# Patient Record
Sex: Male | Born: 1945 | Race: White | Hispanic: No | Marital: Married | Smoking: Former smoker
Health system: Southern US, Community
[De-identification: ages and names within clinical notes are randomized; demographics above are authoritative.]

## PROBLEM LIST (undated history)

## (undated) DIAGNOSIS — I10 Essential (primary) hypertension: Secondary | ICD-10-CM

## (undated) DIAGNOSIS — I2510 Atherosclerotic heart disease of native coronary artery without angina pectoris: Secondary | ICD-10-CM

## (undated) DIAGNOSIS — J45909 Unspecified asthma, uncomplicated: Secondary | ICD-10-CM

## (undated) DIAGNOSIS — E785 Hyperlipidemia, unspecified: Secondary | ICD-10-CM

## (undated) HISTORY — PX: BACK SURGERY: SHX140

## (undated) HISTORY — DX: Unspecified asthma, uncomplicated: J45.909

## (undated) HISTORY — DX: Hyperlipidemia, unspecified: E78.5

## (undated) HISTORY — PX: KNEE SURGERY: SHX244

## (undated) HISTORY — DX: Atherosclerotic heart disease of native coronary artery without angina pectoris: I25.10

## (undated) HISTORY — DX: Essential (primary) hypertension: I10

---

## 2004-08-05 ENCOUNTER — Ambulatory Visit: Payer: Self-pay | Admitting: Orthopaedic Surgery

## 2004-09-27 ENCOUNTER — Ambulatory Visit: Payer: Self-pay | Admitting: Unknown Physician Specialty

## 2004-09-30 ENCOUNTER — Ambulatory Visit: Payer: Self-pay | Admitting: Unknown Physician Specialty

## 2004-12-25 ENCOUNTER — Ambulatory Visit: Payer: Self-pay | Admitting: Orthopaedic Surgery

## 2007-09-13 ENCOUNTER — Ambulatory Visit: Payer: Self-pay | Admitting: Unknown Physician Specialty

## 2007-09-15 ENCOUNTER — Ambulatory Visit: Payer: Self-pay | Admitting: Unknown Physician Specialty

## 2007-10-01 ENCOUNTER — Ambulatory Visit: Payer: Self-pay | Admitting: Rheumatology

## 2007-10-14 ENCOUNTER — Ambulatory Visit: Payer: Self-pay | Admitting: Unknown Physician Specialty

## 2007-11-02 ENCOUNTER — Ambulatory Visit: Payer: Self-pay | Admitting: Unknown Physician Specialty

## 2009-07-31 ENCOUNTER — Ambulatory Visit (HOSPITAL_COMMUNITY)
Admission: AD | Admit: 2009-07-31 | Discharge: 2009-08-01 | Payer: Self-pay | Source: Home / Self Care | Admitting: Specialist

## 2010-05-13 LAB — DIFFERENTIAL
Basophils Absolute: 0 10*3/uL (ref 0.0–0.1)
Eosinophils Relative: 0 % (ref 0–5)
Lymphs Abs: 1.3 10*3/uL (ref 0.7–4.0)
Neutro Abs: 11 10*3/uL — ABNORMAL HIGH (ref 1.7–7.7)

## 2010-05-13 LAB — URINALYSIS, MICROSCOPIC ONLY
Bilirubin Urine: NEGATIVE
Glucose, UA: NEGATIVE mg/dL
Ketones, ur: NEGATIVE mg/dL
Nitrite: NEGATIVE
Protein, ur: NEGATIVE mg/dL
Specific Gravity, Urine: 1.008 (ref 1.005–1.030)

## 2010-05-13 LAB — PROTIME-INR
INR: 0.97 (ref 0.00–1.49)
Prothrombin Time: 12.8 seconds (ref 11.6–15.2)

## 2010-05-13 LAB — CBC
Hemoglobin: 14.4 g/dL (ref 13.0–17.0)
RBC: 4.58 MIL/uL (ref 4.22–5.81)
WBC: 13 10*3/uL — ABNORMAL HIGH (ref 4.0–10.5)

## 2010-05-13 LAB — COMPREHENSIVE METABOLIC PANEL
ALT: 37 U/L (ref 0–53)
AST: 28 U/L (ref 0–37)
Albumin: 3.7 g/dL (ref 3.5–5.2)
Calcium: 9.3 mg/dL (ref 8.4–10.5)
Glucose, Bld: 121 mg/dL — ABNORMAL HIGH (ref 70–99)
Potassium: 4.1 mEq/L (ref 3.5–5.1)
Sodium: 135 mEq/L (ref 135–145)
Total Bilirubin: 0.7 mg/dL (ref 0.3–1.2)
Total Protein: 6.7 g/dL (ref 6.0–8.3)

## 2014-04-18 DIAGNOSIS — M25562 Pain in left knee: Secondary | ICD-10-CM

## 2014-04-18 DIAGNOSIS — M1711 Unilateral primary osteoarthritis, right knee: Secondary | ICD-10-CM

## 2014-04-18 HISTORY — DX: Unilateral primary osteoarthritis, right knee: M17.11

## 2014-04-18 HISTORY — DX: Pain in left knee: M25.562

## 2014-04-27 ENCOUNTER — Ambulatory Visit: Payer: Self-pay | Admitting: Unknown Physician Specialty

## 2014-05-12 ENCOUNTER — Ambulatory Visit: Payer: Self-pay | Admitting: Unknown Physician Specialty

## 2014-05-18 DIAGNOSIS — S83242D Other tear of medial meniscus, current injury, left knee, subsequent encounter: Secondary | ICD-10-CM | POA: Insufficient documentation

## 2014-05-18 HISTORY — DX: Other tear of medial meniscus, current injury, left knee, subsequent encounter: S83.242D

## 2014-12-05 DIAGNOSIS — I2510 Atherosclerotic heart disease of native coronary artery without angina pectoris: Secondary | ICD-10-CM

## 2014-12-05 DIAGNOSIS — E785 Hyperlipidemia, unspecified: Secondary | ICD-10-CM | POA: Insufficient documentation

## 2014-12-05 HISTORY — DX: Atherosclerotic heart disease of native coronary artery without angina pectoris: I25.10

## 2016-02-27 DIAGNOSIS — I10 Essential (primary) hypertension: Secondary | ICD-10-CM | POA: Diagnosis not present

## 2016-02-27 DIAGNOSIS — I25119 Atherosclerotic heart disease of native coronary artery with unspecified angina pectoris: Secondary | ICD-10-CM | POA: Diagnosis not present

## 2016-02-27 DIAGNOSIS — E784 Other hyperlipidemia: Secondary | ICD-10-CM | POA: Diagnosis not present

## 2016-03-06 DIAGNOSIS — I252 Old myocardial infarction: Secondary | ICD-10-CM | POA: Diagnosis not present

## 2016-03-06 DIAGNOSIS — I10 Essential (primary) hypertension: Secondary | ICD-10-CM | POA: Diagnosis not present

## 2016-03-06 DIAGNOSIS — E785 Hyperlipidemia, unspecified: Secondary | ICD-10-CM | POA: Diagnosis not present

## 2016-03-06 DIAGNOSIS — Z87891 Personal history of nicotine dependence: Secondary | ICD-10-CM | POA: Diagnosis not present

## 2016-03-06 DIAGNOSIS — K219 Gastro-esophageal reflux disease without esophagitis: Secondary | ICD-10-CM | POA: Diagnosis not present

## 2016-03-06 DIAGNOSIS — M1712 Unilateral primary osteoarthritis, left knee: Secondary | ICD-10-CM | POA: Diagnosis not present

## 2016-03-06 DIAGNOSIS — Z7982 Long term (current) use of aspirin: Secondary | ICD-10-CM | POA: Diagnosis not present

## 2016-03-06 DIAGNOSIS — Z96651 Presence of right artificial knee joint: Secondary | ICD-10-CM | POA: Diagnosis not present

## 2016-03-06 DIAGNOSIS — Z471 Aftercare following joint replacement surgery: Secondary | ICD-10-CM | POA: Diagnosis not present

## 2016-03-06 DIAGNOSIS — Z79891 Long term (current) use of opiate analgesic: Secondary | ICD-10-CM | POA: Diagnosis not present

## 2016-05-28 DIAGNOSIS — I10 Essential (primary) hypertension: Secondary | ICD-10-CM | POA: Diagnosis not present

## 2016-05-28 DIAGNOSIS — E784 Other hyperlipidemia: Secondary | ICD-10-CM | POA: Diagnosis not present

## 2016-05-28 DIAGNOSIS — I25119 Atherosclerotic heart disease of native coronary artery with unspecified angina pectoris: Secondary | ICD-10-CM | POA: Diagnosis not present

## 2016-07-22 DIAGNOSIS — I10 Essential (primary) hypertension: Secondary | ICD-10-CM | POA: Diagnosis not present

## 2016-07-22 DIAGNOSIS — Z1211 Encounter for screening for malignant neoplasm of colon: Secondary | ICD-10-CM | POA: Diagnosis not present

## 2016-07-22 DIAGNOSIS — Z125 Encounter for screening for malignant neoplasm of prostate: Secondary | ICD-10-CM | POA: Diagnosis not present

## 2016-07-22 DIAGNOSIS — Z0000 Encounter for general adult medical examination without abnormal findings: Secondary | ICD-10-CM | POA: Diagnosis not present

## 2016-07-22 DIAGNOSIS — E785 Hyperlipidemia, unspecified: Secondary | ICD-10-CM | POA: Diagnosis not present

## 2016-08-07 DIAGNOSIS — I119 Hypertensive heart disease without heart failure: Secondary | ICD-10-CM | POA: Diagnosis not present

## 2016-08-07 DIAGNOSIS — Z79899 Other long term (current) drug therapy: Secondary | ICD-10-CM | POA: Diagnosis not present

## 2016-08-07 DIAGNOSIS — M545 Low back pain: Secondary | ICD-10-CM | POA: Diagnosis not present

## 2016-08-07 DIAGNOSIS — E785 Hyperlipidemia, unspecified: Secondary | ICD-10-CM | POA: Diagnosis not present

## 2016-11-11 DIAGNOSIS — Z6830 Body mass index (BMI) 30.0-30.9, adult: Secondary | ICD-10-CM | POA: Diagnosis not present

## 2016-11-11 DIAGNOSIS — E785 Hyperlipidemia, unspecified: Secondary | ICD-10-CM | POA: Diagnosis not present

## 2016-11-11 DIAGNOSIS — K219 Gastro-esophageal reflux disease without esophagitis: Secondary | ICD-10-CM | POA: Diagnosis not present

## 2016-11-11 DIAGNOSIS — M130 Polyarthritis, unspecified: Secondary | ICD-10-CM | POA: Diagnosis not present

## 2016-11-11 DIAGNOSIS — G4700 Insomnia, unspecified: Secondary | ICD-10-CM | POA: Diagnosis not present

## 2016-11-11 DIAGNOSIS — I252 Old myocardial infarction: Secondary | ICD-10-CM | POA: Diagnosis not present

## 2016-11-11 DIAGNOSIS — I10 Essential (primary) hypertension: Secondary | ICD-10-CM | POA: Diagnosis not present

## 2016-11-11 DIAGNOSIS — Z0000 Encounter for general adult medical examination without abnormal findings: Secondary | ICD-10-CM | POA: Diagnosis not present

## 2016-11-11 DIAGNOSIS — Z7982 Long term (current) use of aspirin: Secondary | ICD-10-CM | POA: Diagnosis not present

## 2016-11-11 DIAGNOSIS — E669 Obesity, unspecified: Secondary | ICD-10-CM | POA: Diagnosis not present

## 2016-12-04 DIAGNOSIS — M170 Bilateral primary osteoarthritis of knee: Secondary | ICD-10-CM | POA: Diagnosis not present

## 2016-12-16 DIAGNOSIS — E785 Hyperlipidemia, unspecified: Secondary | ICD-10-CM | POA: Insufficient documentation

## 2016-12-16 DIAGNOSIS — Z87891 Personal history of nicotine dependence: Secondary | ICD-10-CM | POA: Diagnosis not present

## 2016-12-16 DIAGNOSIS — Z7982 Long term (current) use of aspirin: Secondary | ICD-10-CM | POA: Diagnosis not present

## 2016-12-16 DIAGNOSIS — I2510 Atherosclerotic heart disease of native coronary artery without angina pectoris: Secondary | ICD-10-CM | POA: Diagnosis not present

## 2016-12-16 HISTORY — DX: Hyperlipidemia, unspecified: E78.5

## 2017-01-21 DIAGNOSIS — Z01812 Encounter for preprocedural laboratory examination: Secondary | ICD-10-CM | POA: Diagnosis not present

## 2017-01-21 DIAGNOSIS — M1711 Unilateral primary osteoarthritis, right knee: Secondary | ICD-10-CM | POA: Diagnosis not present

## 2017-01-21 DIAGNOSIS — Z01818 Encounter for other preprocedural examination: Secondary | ICD-10-CM | POA: Diagnosis not present

## 2017-01-21 DIAGNOSIS — I10 Essential (primary) hypertension: Secondary | ICD-10-CM | POA: Diagnosis not present

## 2017-01-21 DIAGNOSIS — I252 Old myocardial infarction: Secondary | ICD-10-CM | POA: Diagnosis not present

## 2017-02-06 DIAGNOSIS — I10 Essential (primary) hypertension: Secondary | ICD-10-CM | POA: Diagnosis not present

## 2017-02-06 DIAGNOSIS — M1711 Unilateral primary osteoarthritis, right knee: Secondary | ICD-10-CM | POA: Diagnosis not present

## 2017-02-06 DIAGNOSIS — E785 Hyperlipidemia, unspecified: Secondary | ICD-10-CM | POA: Diagnosis not present

## 2017-02-06 DIAGNOSIS — M25561 Pain in right knee: Secondary | ICD-10-CM | POA: Diagnosis not present

## 2017-02-06 DIAGNOSIS — Z7982 Long term (current) use of aspirin: Secondary | ICD-10-CM | POA: Diagnosis not present

## 2017-02-06 DIAGNOSIS — M1712 Unilateral primary osteoarthritis, left knee: Secondary | ICD-10-CM | POA: Diagnosis not present

## 2017-02-06 DIAGNOSIS — Z9861 Coronary angioplasty status: Secondary | ICD-10-CM | POA: Diagnosis not present

## 2017-02-06 DIAGNOSIS — G8918 Other acute postprocedural pain: Secondary | ICD-10-CM | POA: Diagnosis not present

## 2017-02-06 DIAGNOSIS — K219 Gastro-esophageal reflux disease without esophagitis: Secondary | ICD-10-CM | POA: Diagnosis not present

## 2017-02-06 DIAGNOSIS — I252 Old myocardial infarction: Secondary | ICD-10-CM | POA: Diagnosis not present

## 2017-02-07 DIAGNOSIS — M1711 Unilateral primary osteoarthritis, right knee: Secondary | ICD-10-CM | POA: Diagnosis not present

## 2017-02-09 DIAGNOSIS — E785 Hyperlipidemia, unspecified: Secondary | ICD-10-CM | POA: Diagnosis not present

## 2017-02-09 DIAGNOSIS — I10 Essential (primary) hypertension: Secondary | ICD-10-CM | POA: Diagnosis not present

## 2017-02-09 DIAGNOSIS — Z471 Aftercare following joint replacement surgery: Secondary | ICD-10-CM | POA: Diagnosis not present

## 2017-02-09 DIAGNOSIS — K219 Gastro-esophageal reflux disease without esophagitis: Secondary | ICD-10-CM | POA: Diagnosis not present

## 2017-02-09 DIAGNOSIS — M1712 Unilateral primary osteoarthritis, left knee: Secondary | ICD-10-CM | POA: Diagnosis not present

## 2017-02-09 DIAGNOSIS — Z7982 Long term (current) use of aspirin: Secondary | ICD-10-CM | POA: Diagnosis not present

## 2017-02-09 DIAGNOSIS — Z79891 Long term (current) use of opiate analgesic: Secondary | ICD-10-CM | POA: Diagnosis not present

## 2017-02-09 DIAGNOSIS — I252 Old myocardial infarction: Secondary | ICD-10-CM | POA: Diagnosis not present

## 2017-02-09 DIAGNOSIS — Z87891 Personal history of nicotine dependence: Secondary | ICD-10-CM | POA: Diagnosis not present

## 2017-02-09 DIAGNOSIS — Z96651 Presence of right artificial knee joint: Secondary | ICD-10-CM | POA: Diagnosis not present

## 2017-02-10 DIAGNOSIS — Z96651 Presence of right artificial knee joint: Secondary | ICD-10-CM | POA: Diagnosis not present

## 2017-02-11 DIAGNOSIS — Z79891 Long term (current) use of opiate analgesic: Secondary | ICD-10-CM | POA: Diagnosis not present

## 2017-02-11 DIAGNOSIS — Z87891 Personal history of nicotine dependence: Secondary | ICD-10-CM | POA: Diagnosis not present

## 2017-02-11 DIAGNOSIS — M1712 Unilateral primary osteoarthritis, left knee: Secondary | ICD-10-CM | POA: Diagnosis not present

## 2017-02-11 DIAGNOSIS — E785 Hyperlipidemia, unspecified: Secondary | ICD-10-CM | POA: Diagnosis not present

## 2017-02-11 DIAGNOSIS — Z471 Aftercare following joint replacement surgery: Secondary | ICD-10-CM | POA: Diagnosis not present

## 2017-02-11 DIAGNOSIS — Z7982 Long term (current) use of aspirin: Secondary | ICD-10-CM | POA: Diagnosis not present

## 2017-02-11 DIAGNOSIS — I252 Old myocardial infarction: Secondary | ICD-10-CM | POA: Diagnosis not present

## 2017-02-11 DIAGNOSIS — K219 Gastro-esophageal reflux disease without esophagitis: Secondary | ICD-10-CM | POA: Diagnosis not present

## 2017-02-11 DIAGNOSIS — Z96651 Presence of right artificial knee joint: Secondary | ICD-10-CM | POA: Diagnosis not present

## 2017-02-11 DIAGNOSIS — I10 Essential (primary) hypertension: Secondary | ICD-10-CM | POA: Diagnosis not present

## 2017-02-13 DIAGNOSIS — Z79891 Long term (current) use of opiate analgesic: Secondary | ICD-10-CM | POA: Diagnosis not present

## 2017-02-13 DIAGNOSIS — Z7982 Long term (current) use of aspirin: Secondary | ICD-10-CM | POA: Diagnosis not present

## 2017-02-13 DIAGNOSIS — E785 Hyperlipidemia, unspecified: Secondary | ICD-10-CM | POA: Diagnosis not present

## 2017-02-13 DIAGNOSIS — K219 Gastro-esophageal reflux disease without esophagitis: Secondary | ICD-10-CM | POA: Diagnosis not present

## 2017-02-13 DIAGNOSIS — M1712 Unilateral primary osteoarthritis, left knee: Secondary | ICD-10-CM | POA: Diagnosis not present

## 2017-02-13 DIAGNOSIS — I252 Old myocardial infarction: Secondary | ICD-10-CM | POA: Diagnosis not present

## 2017-02-13 DIAGNOSIS — Z471 Aftercare following joint replacement surgery: Secondary | ICD-10-CM | POA: Diagnosis not present

## 2017-02-13 DIAGNOSIS — Z96651 Presence of right artificial knee joint: Secondary | ICD-10-CM | POA: Diagnosis not present

## 2017-02-13 DIAGNOSIS — I10 Essential (primary) hypertension: Secondary | ICD-10-CM | POA: Diagnosis not present

## 2017-02-13 DIAGNOSIS — Z87891 Personal history of nicotine dependence: Secondary | ICD-10-CM | POA: Diagnosis not present

## 2017-02-16 DIAGNOSIS — Z471 Aftercare following joint replacement surgery: Secondary | ICD-10-CM | POA: Diagnosis not present

## 2017-02-16 DIAGNOSIS — Z7982 Long term (current) use of aspirin: Secondary | ICD-10-CM | POA: Diagnosis not present

## 2017-02-16 DIAGNOSIS — E785 Hyperlipidemia, unspecified: Secondary | ICD-10-CM | POA: Diagnosis not present

## 2017-02-16 DIAGNOSIS — Z96651 Presence of right artificial knee joint: Secondary | ICD-10-CM | POA: Diagnosis not present

## 2017-02-16 DIAGNOSIS — M1712 Unilateral primary osteoarthritis, left knee: Secondary | ICD-10-CM | POA: Diagnosis not present

## 2017-02-16 DIAGNOSIS — I252 Old myocardial infarction: Secondary | ICD-10-CM | POA: Diagnosis not present

## 2017-02-16 DIAGNOSIS — Z87891 Personal history of nicotine dependence: Secondary | ICD-10-CM | POA: Diagnosis not present

## 2017-02-16 DIAGNOSIS — Z79891 Long term (current) use of opiate analgesic: Secondary | ICD-10-CM | POA: Diagnosis not present

## 2017-02-16 DIAGNOSIS — K219 Gastro-esophageal reflux disease without esophagitis: Secondary | ICD-10-CM | POA: Diagnosis not present

## 2017-02-16 DIAGNOSIS — I10 Essential (primary) hypertension: Secondary | ICD-10-CM | POA: Diagnosis not present

## 2017-02-20 DIAGNOSIS — I252 Old myocardial infarction: Secondary | ICD-10-CM | POA: Diagnosis not present

## 2017-02-20 DIAGNOSIS — E785 Hyperlipidemia, unspecified: Secondary | ICD-10-CM | POA: Diagnosis not present

## 2017-02-20 DIAGNOSIS — Z7982 Long term (current) use of aspirin: Secondary | ICD-10-CM | POA: Diagnosis not present

## 2017-02-20 DIAGNOSIS — Z471 Aftercare following joint replacement surgery: Secondary | ICD-10-CM | POA: Diagnosis not present

## 2017-02-20 DIAGNOSIS — Z87891 Personal history of nicotine dependence: Secondary | ICD-10-CM | POA: Diagnosis not present

## 2017-02-20 DIAGNOSIS — Z96651 Presence of right artificial knee joint: Secondary | ICD-10-CM | POA: Diagnosis not present

## 2017-02-20 DIAGNOSIS — Z79891 Long term (current) use of opiate analgesic: Secondary | ICD-10-CM | POA: Diagnosis not present

## 2017-02-20 DIAGNOSIS — K219 Gastro-esophageal reflux disease without esophagitis: Secondary | ICD-10-CM | POA: Diagnosis not present

## 2017-02-20 DIAGNOSIS — I10 Essential (primary) hypertension: Secondary | ICD-10-CM | POA: Diagnosis not present

## 2017-02-20 DIAGNOSIS — M1712 Unilateral primary osteoarthritis, left knee: Secondary | ICD-10-CM | POA: Diagnosis not present

## 2017-02-21 DIAGNOSIS — M1712 Unilateral primary osteoarthritis, left knee: Secondary | ICD-10-CM | POA: Diagnosis not present

## 2017-02-21 DIAGNOSIS — Z471 Aftercare following joint replacement surgery: Secondary | ICD-10-CM | POA: Diagnosis not present

## 2017-02-21 DIAGNOSIS — K219 Gastro-esophageal reflux disease without esophagitis: Secondary | ICD-10-CM | POA: Diagnosis not present

## 2017-02-21 DIAGNOSIS — Z96651 Presence of right artificial knee joint: Secondary | ICD-10-CM | POA: Diagnosis not present

## 2017-02-21 DIAGNOSIS — E785 Hyperlipidemia, unspecified: Secondary | ICD-10-CM | POA: Diagnosis not present

## 2017-02-21 DIAGNOSIS — Z7982 Long term (current) use of aspirin: Secondary | ICD-10-CM | POA: Diagnosis not present

## 2017-02-21 DIAGNOSIS — Z79891 Long term (current) use of opiate analgesic: Secondary | ICD-10-CM | POA: Diagnosis not present

## 2017-02-21 DIAGNOSIS — I10 Essential (primary) hypertension: Secondary | ICD-10-CM | POA: Diagnosis not present

## 2017-02-21 DIAGNOSIS — I252 Old myocardial infarction: Secondary | ICD-10-CM | POA: Diagnosis not present

## 2017-02-21 DIAGNOSIS — Z87891 Personal history of nicotine dependence: Secondary | ICD-10-CM | POA: Diagnosis not present

## 2017-02-23 DIAGNOSIS — M1712 Unilateral primary osteoarthritis, left knee: Secondary | ICD-10-CM | POA: Diagnosis not present

## 2017-02-23 DIAGNOSIS — I10 Essential (primary) hypertension: Secondary | ICD-10-CM | POA: Diagnosis not present

## 2017-02-23 DIAGNOSIS — Z87891 Personal history of nicotine dependence: Secondary | ICD-10-CM | POA: Diagnosis not present

## 2017-02-23 DIAGNOSIS — Z96651 Presence of right artificial knee joint: Secondary | ICD-10-CM | POA: Diagnosis not present

## 2017-02-23 DIAGNOSIS — K219 Gastro-esophageal reflux disease without esophagitis: Secondary | ICD-10-CM | POA: Diagnosis not present

## 2017-02-23 DIAGNOSIS — Z79891 Long term (current) use of opiate analgesic: Secondary | ICD-10-CM | POA: Diagnosis not present

## 2017-02-23 DIAGNOSIS — Z7982 Long term (current) use of aspirin: Secondary | ICD-10-CM | POA: Diagnosis not present

## 2017-02-23 DIAGNOSIS — E785 Hyperlipidemia, unspecified: Secondary | ICD-10-CM | POA: Diagnosis not present

## 2017-02-23 DIAGNOSIS — Z471 Aftercare following joint replacement surgery: Secondary | ICD-10-CM | POA: Diagnosis not present

## 2017-02-23 DIAGNOSIS — I252 Old myocardial infarction: Secondary | ICD-10-CM | POA: Diagnosis not present

## 2017-02-25 DIAGNOSIS — I252 Old myocardial infarction: Secondary | ICD-10-CM | POA: Diagnosis not present

## 2017-02-25 DIAGNOSIS — Z79891 Long term (current) use of opiate analgesic: Secondary | ICD-10-CM | POA: Diagnosis not present

## 2017-02-25 DIAGNOSIS — I10 Essential (primary) hypertension: Secondary | ICD-10-CM | POA: Diagnosis not present

## 2017-02-25 DIAGNOSIS — E785 Hyperlipidemia, unspecified: Secondary | ICD-10-CM | POA: Diagnosis not present

## 2017-02-25 DIAGNOSIS — Z96651 Presence of right artificial knee joint: Secondary | ICD-10-CM | POA: Diagnosis not present

## 2017-02-25 DIAGNOSIS — M1712 Unilateral primary osteoarthritis, left knee: Secondary | ICD-10-CM | POA: Diagnosis not present

## 2017-02-25 DIAGNOSIS — K219 Gastro-esophageal reflux disease without esophagitis: Secondary | ICD-10-CM | POA: Diagnosis not present

## 2017-02-25 DIAGNOSIS — Z471 Aftercare following joint replacement surgery: Secondary | ICD-10-CM | POA: Diagnosis not present

## 2017-02-25 DIAGNOSIS — Z87891 Personal history of nicotine dependence: Secondary | ICD-10-CM | POA: Diagnosis not present

## 2017-02-25 DIAGNOSIS — Z7982 Long term (current) use of aspirin: Secondary | ICD-10-CM | POA: Diagnosis not present

## 2017-02-27 DIAGNOSIS — M1712 Unilateral primary osteoarthritis, left knee: Secondary | ICD-10-CM | POA: Diagnosis not present

## 2017-02-27 DIAGNOSIS — K219 Gastro-esophageal reflux disease without esophagitis: Secondary | ICD-10-CM | POA: Diagnosis not present

## 2017-02-27 DIAGNOSIS — I252 Old myocardial infarction: Secondary | ICD-10-CM | POA: Diagnosis not present

## 2017-02-27 DIAGNOSIS — Z7982 Long term (current) use of aspirin: Secondary | ICD-10-CM | POA: Diagnosis not present

## 2017-02-27 DIAGNOSIS — Z87891 Personal history of nicotine dependence: Secondary | ICD-10-CM | POA: Diagnosis not present

## 2017-02-27 DIAGNOSIS — Z96651 Presence of right artificial knee joint: Secondary | ICD-10-CM | POA: Diagnosis not present

## 2017-02-27 DIAGNOSIS — Z471 Aftercare following joint replacement surgery: Secondary | ICD-10-CM | POA: Diagnosis not present

## 2017-02-27 DIAGNOSIS — E785 Hyperlipidemia, unspecified: Secondary | ICD-10-CM | POA: Diagnosis not present

## 2017-02-27 DIAGNOSIS — I10 Essential (primary) hypertension: Secondary | ICD-10-CM | POA: Diagnosis not present

## 2017-02-27 DIAGNOSIS — Z79891 Long term (current) use of opiate analgesic: Secondary | ICD-10-CM | POA: Diagnosis not present

## 2017-03-02 DIAGNOSIS — Z471 Aftercare following joint replacement surgery: Secondary | ICD-10-CM | POA: Diagnosis not present

## 2017-03-02 DIAGNOSIS — I252 Old myocardial infarction: Secondary | ICD-10-CM | POA: Diagnosis not present

## 2017-03-02 DIAGNOSIS — I10 Essential (primary) hypertension: Secondary | ICD-10-CM | POA: Diagnosis not present

## 2017-03-02 DIAGNOSIS — E785 Hyperlipidemia, unspecified: Secondary | ICD-10-CM | POA: Diagnosis not present

## 2017-03-02 DIAGNOSIS — M1712 Unilateral primary osteoarthritis, left knee: Secondary | ICD-10-CM | POA: Diagnosis not present

## 2017-03-02 DIAGNOSIS — Z96651 Presence of right artificial knee joint: Secondary | ICD-10-CM | POA: Diagnosis not present

## 2017-03-02 DIAGNOSIS — Z87891 Personal history of nicotine dependence: Secondary | ICD-10-CM | POA: Diagnosis not present

## 2017-03-02 DIAGNOSIS — Z7982 Long term (current) use of aspirin: Secondary | ICD-10-CM | POA: Diagnosis not present

## 2017-03-02 DIAGNOSIS — Z79891 Long term (current) use of opiate analgesic: Secondary | ICD-10-CM | POA: Diagnosis not present

## 2017-03-02 DIAGNOSIS — K219 Gastro-esophageal reflux disease without esophagitis: Secondary | ICD-10-CM | POA: Diagnosis not present

## 2017-03-04 DIAGNOSIS — Z7982 Long term (current) use of aspirin: Secondary | ICD-10-CM | POA: Diagnosis not present

## 2017-03-04 DIAGNOSIS — I252 Old myocardial infarction: Secondary | ICD-10-CM | POA: Diagnosis not present

## 2017-03-04 DIAGNOSIS — Z87891 Personal history of nicotine dependence: Secondary | ICD-10-CM | POA: Diagnosis not present

## 2017-03-04 DIAGNOSIS — Z96651 Presence of right artificial knee joint: Secondary | ICD-10-CM | POA: Diagnosis not present

## 2017-03-04 DIAGNOSIS — I10 Essential (primary) hypertension: Secondary | ICD-10-CM | POA: Diagnosis not present

## 2017-03-04 DIAGNOSIS — E785 Hyperlipidemia, unspecified: Secondary | ICD-10-CM | POA: Diagnosis not present

## 2017-03-04 DIAGNOSIS — Z471 Aftercare following joint replacement surgery: Secondary | ICD-10-CM | POA: Diagnosis not present

## 2017-03-04 DIAGNOSIS — K219 Gastro-esophageal reflux disease without esophagitis: Secondary | ICD-10-CM | POA: Diagnosis not present

## 2017-03-04 DIAGNOSIS — Z79891 Long term (current) use of opiate analgesic: Secondary | ICD-10-CM | POA: Diagnosis not present

## 2017-03-04 DIAGNOSIS — M1712 Unilateral primary osteoarthritis, left knee: Secondary | ICD-10-CM | POA: Diagnosis not present

## 2017-03-05 DIAGNOSIS — M7918 Myalgia, other site: Secondary | ICD-10-CM | POA: Insufficient documentation

## 2017-03-05 HISTORY — DX: Myalgia, other site: M79.18

## 2017-03-06 ENCOUNTER — Other Ambulatory Visit: Payer: Self-pay | Admitting: Unknown Physician Specialty

## 2017-03-06 DIAGNOSIS — Z7982 Long term (current) use of aspirin: Secondary | ICD-10-CM | POA: Diagnosis not present

## 2017-03-06 DIAGNOSIS — I10 Essential (primary) hypertension: Secondary | ICD-10-CM | POA: Diagnosis not present

## 2017-03-06 DIAGNOSIS — I252 Old myocardial infarction: Secondary | ICD-10-CM | POA: Diagnosis not present

## 2017-03-06 DIAGNOSIS — Z96651 Presence of right artificial knee joint: Secondary | ICD-10-CM | POA: Diagnosis not present

## 2017-03-06 DIAGNOSIS — K219 Gastro-esophageal reflux disease without esophagitis: Secondary | ICD-10-CM | POA: Diagnosis not present

## 2017-03-06 DIAGNOSIS — Z471 Aftercare following joint replacement surgery: Secondary | ICD-10-CM | POA: Diagnosis not present

## 2017-03-06 DIAGNOSIS — E785 Hyperlipidemia, unspecified: Secondary | ICD-10-CM | POA: Diagnosis not present

## 2017-03-06 DIAGNOSIS — M7918 Myalgia, other site: Secondary | ICD-10-CM

## 2017-03-06 DIAGNOSIS — M1712 Unilateral primary osteoarthritis, left knee: Secondary | ICD-10-CM | POA: Diagnosis not present

## 2017-03-06 DIAGNOSIS — Z87891 Personal history of nicotine dependence: Secondary | ICD-10-CM | POA: Diagnosis not present

## 2017-03-06 DIAGNOSIS — Z79891 Long term (current) use of opiate analgesic: Secondary | ICD-10-CM | POA: Diagnosis not present

## 2017-03-11 DIAGNOSIS — I10 Essential (primary) hypertension: Secondary | ICD-10-CM | POA: Diagnosis not present

## 2017-03-11 DIAGNOSIS — E785 Hyperlipidemia, unspecified: Secondary | ICD-10-CM | POA: Diagnosis not present

## 2017-03-11 DIAGNOSIS — Z79899 Other long term (current) drug therapy: Secondary | ICD-10-CM | POA: Diagnosis not present

## 2017-03-16 DIAGNOSIS — Z96651 Presence of right artificial knee joint: Secondary | ICD-10-CM | POA: Diagnosis not present

## 2017-03-16 DIAGNOSIS — Z471 Aftercare following joint replacement surgery: Secondary | ICD-10-CM | POA: Diagnosis not present

## 2017-03-16 DIAGNOSIS — R2689 Other abnormalities of gait and mobility: Secondary | ICD-10-CM | POA: Diagnosis not present

## 2017-03-16 DIAGNOSIS — M25551 Pain in right hip: Secondary | ICD-10-CM | POA: Diagnosis not present

## 2017-03-16 DIAGNOSIS — M6281 Muscle weakness (generalized): Secondary | ICD-10-CM | POA: Diagnosis not present

## 2017-04-03 DIAGNOSIS — M5416 Radiculopathy, lumbar region: Secondary | ICD-10-CM | POA: Insufficient documentation

## 2017-04-03 HISTORY — DX: Radiculopathy, lumbar region: M54.16

## 2017-04-17 ENCOUNTER — Ambulatory Visit
Admission: RE | Admit: 2017-04-17 | Discharge: 2017-04-17 | Disposition: A | Discharge status: Home / Self Care | Payer: Medicare HMO | Source: Ambulatory Visit | Attending: Unknown Physician Specialty | Admitting: Unknown Physician Specialty

## 2017-04-17 DIAGNOSIS — M4807 Spinal stenosis, lumbosacral region: Secondary | ICD-10-CM | POA: Insufficient documentation

## 2017-04-17 DIAGNOSIS — M5126 Other intervertebral disc displacement, lumbar region: Secondary | ICD-10-CM | POA: Diagnosis not present

## 2017-04-17 DIAGNOSIS — Z96651 Presence of right artificial knee joint: Secondary | ICD-10-CM | POA: Diagnosis not present

## 2017-04-17 DIAGNOSIS — M7918 Myalgia, other site: Secondary | ICD-10-CM | POA: Diagnosis not present

## 2017-04-17 DIAGNOSIS — M48061 Spinal stenosis, lumbar region without neurogenic claudication: Secondary | ICD-10-CM | POA: Insufficient documentation

## 2017-04-24 DIAGNOSIS — Z96651 Presence of right artificial knee joint: Secondary | ICD-10-CM | POA: Diagnosis not present

## 2017-05-12 DIAGNOSIS — Z96651 Presence of right artificial knee joint: Secondary | ICD-10-CM | POA: Diagnosis not present

## 2017-07-29 DIAGNOSIS — Z139 Encounter for screening, unspecified: Secondary | ICD-10-CM | POA: Diagnosis not present

## 2017-07-29 DIAGNOSIS — E785 Hyperlipidemia, unspecified: Secondary | ICD-10-CM | POA: Diagnosis not present

## 2017-07-29 DIAGNOSIS — Z125 Encounter for screening for malignant neoplasm of prostate: Secondary | ICD-10-CM | POA: Diagnosis not present

## 2017-07-29 DIAGNOSIS — Z0000 Encounter for general adult medical examination without abnormal findings: Secondary | ICD-10-CM | POA: Diagnosis not present

## 2017-07-29 DIAGNOSIS — Z9181 History of falling: Secondary | ICD-10-CM | POA: Diagnosis not present

## 2017-07-29 DIAGNOSIS — Z1331 Encounter for screening for depression: Secondary | ICD-10-CM | POA: Diagnosis not present

## 2017-07-30 DIAGNOSIS — I252 Old myocardial infarction: Secondary | ICD-10-CM | POA: Diagnosis not present

## 2017-07-30 DIAGNOSIS — I10 Essential (primary) hypertension: Secondary | ICD-10-CM | POA: Diagnosis not present

## 2017-07-30 DIAGNOSIS — R52 Pain, unspecified: Secondary | ICD-10-CM | POA: Diagnosis not present

## 2017-07-30 DIAGNOSIS — E785 Hyperlipidemia, unspecified: Secondary | ICD-10-CM | POA: Diagnosis not present

## 2017-07-30 DIAGNOSIS — Z809 Family history of malignant neoplasm, unspecified: Secondary | ICD-10-CM | POA: Diagnosis not present

## 2017-07-30 DIAGNOSIS — N529 Male erectile dysfunction, unspecified: Secondary | ICD-10-CM | POA: Diagnosis not present

## 2017-07-30 DIAGNOSIS — I2510 Atherosclerotic heart disease of native coronary artery without angina pectoris: Secondary | ICD-10-CM | POA: Diagnosis not present

## 2017-07-30 DIAGNOSIS — Z7722 Contact with and (suspected) exposure to environmental tobacco smoke (acute) (chronic): Secondary | ICD-10-CM | POA: Diagnosis not present

## 2017-07-30 DIAGNOSIS — K219 Gastro-esophageal reflux disease without esophagitis: Secondary | ICD-10-CM | POA: Diagnosis not present

## 2017-07-30 DIAGNOSIS — Z791 Long term (current) use of non-steroidal anti-inflammatories (NSAID): Secondary | ICD-10-CM | POA: Diagnosis not present

## 2017-08-05 ENCOUNTER — Ambulatory Visit (INDEPENDENT_AMBULATORY_CARE_PROVIDER_SITE_OTHER): Payer: Self-pay | Admitting: Specialist

## 2017-08-18 DIAGNOSIS — I2510 Atherosclerotic heart disease of native coronary artery without angina pectoris: Secondary | ICD-10-CM | POA: Diagnosis not present

## 2017-08-18 DIAGNOSIS — E785 Hyperlipidemia, unspecified: Secondary | ICD-10-CM | POA: Diagnosis not present

## 2017-08-18 DIAGNOSIS — I491 Atrial premature depolarization: Secondary | ICD-10-CM | POA: Diagnosis not present

## 2017-08-18 DIAGNOSIS — R001 Bradycardia, unspecified: Secondary | ICD-10-CM | POA: Diagnosis not present

## 2017-08-20 DIAGNOSIS — Z96651 Presence of right artificial knee joint: Secondary | ICD-10-CM | POA: Diagnosis not present

## 2017-08-20 DIAGNOSIS — M79671 Pain in right foot: Secondary | ICD-10-CM | POA: Insufficient documentation

## 2017-08-20 DIAGNOSIS — M5416 Radiculopathy, lumbar region: Secondary | ICD-10-CM | POA: Diagnosis not present

## 2017-08-20 DIAGNOSIS — S9031XA Contusion of right foot, initial encounter: Secondary | ICD-10-CM | POA: Insufficient documentation

## 2017-08-20 HISTORY — DX: Pain in right foot: M79.671

## 2017-08-20 HISTORY — DX: Contusion of right foot, initial encounter: S90.31XA

## 2017-09-14 DIAGNOSIS — K219 Gastro-esophageal reflux disease without esophagitis: Secondary | ICD-10-CM | POA: Diagnosis not present

## 2017-09-14 DIAGNOSIS — I10 Essential (primary) hypertension: Secondary | ICD-10-CM | POA: Diagnosis not present

## 2017-09-14 DIAGNOSIS — E785 Hyperlipidemia, unspecified: Secondary | ICD-10-CM | POA: Diagnosis not present

## 2017-09-14 DIAGNOSIS — I252 Old myocardial infarction: Secondary | ICD-10-CM | POA: Diagnosis not present

## 2017-09-14 DIAGNOSIS — R5382 Chronic fatigue, unspecified: Secondary | ICD-10-CM | POA: Diagnosis not present

## 2017-09-14 DIAGNOSIS — I2510 Atherosclerotic heart disease of native coronary artery without angina pectoris: Secondary | ICD-10-CM | POA: Diagnosis not present

## 2017-09-14 DIAGNOSIS — Z6830 Body mass index (BMI) 30.0-30.9, adult: Secondary | ICD-10-CM | POA: Diagnosis not present

## 2017-09-14 DIAGNOSIS — Z1331 Encounter for screening for depression: Secondary | ICD-10-CM | POA: Diagnosis not present

## 2017-10-15 DIAGNOSIS — J208 Acute bronchitis due to other specified organisms: Secondary | ICD-10-CM | POA: Diagnosis not present

## 2017-10-15 DIAGNOSIS — Z6830 Body mass index (BMI) 30.0-30.9, adult: Secondary | ICD-10-CM | POA: Diagnosis not present

## 2017-10-15 DIAGNOSIS — E785 Hyperlipidemia, unspecified: Secondary | ICD-10-CM | POA: Diagnosis not present

## 2017-10-15 DIAGNOSIS — L309 Dermatitis, unspecified: Secondary | ICD-10-CM | POA: Diagnosis not present

## 2017-10-15 DIAGNOSIS — I10 Essential (primary) hypertension: Secondary | ICD-10-CM | POA: Diagnosis not present

## 2017-10-15 DIAGNOSIS — K219 Gastro-esophageal reflux disease without esophagitis: Secondary | ICD-10-CM | POA: Diagnosis not present

## 2017-10-15 DIAGNOSIS — I252 Old myocardial infarction: Secondary | ICD-10-CM | POA: Diagnosis not present

## 2017-10-15 DIAGNOSIS — E291 Testicular hypofunction: Secondary | ICD-10-CM | POA: Diagnosis not present

## 2017-10-15 DIAGNOSIS — I2510 Atherosclerotic heart disease of native coronary artery without angina pectoris: Secondary | ICD-10-CM | POA: Diagnosis not present

## 2017-10-15 DIAGNOSIS — Z1339 Encounter for screening examination for other mental health and behavioral disorders: Secondary | ICD-10-CM | POA: Diagnosis not present

## 2017-10-30 DIAGNOSIS — X32XXXA Exposure to sunlight, initial encounter: Secondary | ICD-10-CM | POA: Diagnosis not present

## 2017-10-30 DIAGNOSIS — L821 Other seborrheic keratosis: Secondary | ICD-10-CM | POA: Diagnosis not present

## 2017-10-30 DIAGNOSIS — D225 Melanocytic nevi of trunk: Secondary | ICD-10-CM | POA: Diagnosis not present

## 2017-10-30 DIAGNOSIS — L570 Actinic keratosis: Secondary | ICD-10-CM | POA: Diagnosis not present

## 2017-10-30 DIAGNOSIS — D2262 Melanocytic nevi of left upper limb, including shoulder: Secondary | ICD-10-CM | POA: Diagnosis not present

## 2017-11-16 DIAGNOSIS — K219 Gastro-esophageal reflux disease without esophagitis: Secondary | ICD-10-CM | POA: Diagnosis not present

## 2017-11-16 DIAGNOSIS — I252 Old myocardial infarction: Secondary | ICD-10-CM | POA: Diagnosis not present

## 2017-11-16 DIAGNOSIS — E291 Testicular hypofunction: Secondary | ICD-10-CM | POA: Diagnosis not present

## 2017-11-16 DIAGNOSIS — Z6831 Body mass index (BMI) 31.0-31.9, adult: Secondary | ICD-10-CM | POA: Diagnosis not present

## 2017-11-16 DIAGNOSIS — I10 Essential (primary) hypertension: Secondary | ICD-10-CM | POA: Diagnosis not present

## 2017-11-16 DIAGNOSIS — Z23 Encounter for immunization: Secondary | ICD-10-CM | POA: Diagnosis not present

## 2017-11-16 DIAGNOSIS — E785 Hyperlipidemia, unspecified: Secondary | ICD-10-CM | POA: Diagnosis not present

## 2017-11-16 DIAGNOSIS — I2510 Atherosclerotic heart disease of native coronary artery without angina pectoris: Secondary | ICD-10-CM | POA: Diagnosis not present

## 2017-12-14 DIAGNOSIS — Z6831 Body mass index (BMI) 31.0-31.9, adult: Secondary | ICD-10-CM | POA: Diagnosis not present

## 2017-12-14 DIAGNOSIS — K219 Gastro-esophageal reflux disease without esophagitis: Secondary | ICD-10-CM | POA: Diagnosis not present

## 2017-12-14 DIAGNOSIS — I252 Old myocardial infarction: Secondary | ICD-10-CM | POA: Diagnosis not present

## 2017-12-14 DIAGNOSIS — I2510 Atherosclerotic heart disease of native coronary artery without angina pectoris: Secondary | ICD-10-CM | POA: Diagnosis not present

## 2017-12-14 DIAGNOSIS — E785 Hyperlipidemia, unspecified: Secondary | ICD-10-CM | POA: Diagnosis not present

## 2017-12-14 DIAGNOSIS — I10 Essential (primary) hypertension: Secondary | ICD-10-CM | POA: Diagnosis not present

## 2017-12-14 DIAGNOSIS — E291 Testicular hypofunction: Secondary | ICD-10-CM | POA: Diagnosis not present

## 2017-12-14 DIAGNOSIS — J028 Acute pharyngitis due to other specified organisms: Secondary | ICD-10-CM | POA: Diagnosis not present

## 2018-02-08 DIAGNOSIS — Z125 Encounter for screening for malignant neoplasm of prostate: Secondary | ICD-10-CM | POA: Diagnosis not present

## 2018-02-08 DIAGNOSIS — E785 Hyperlipidemia, unspecified: Secondary | ICD-10-CM | POA: Diagnosis not present

## 2018-02-08 DIAGNOSIS — I2510 Atherosclerotic heart disease of native coronary artery without angina pectoris: Secondary | ICD-10-CM | POA: Diagnosis not present

## 2018-02-08 DIAGNOSIS — E291 Testicular hypofunction: Secondary | ICD-10-CM | POA: Diagnosis not present

## 2018-02-08 DIAGNOSIS — I252 Old myocardial infarction: Secondary | ICD-10-CM | POA: Diagnosis not present

## 2018-02-08 DIAGNOSIS — K219 Gastro-esophageal reflux disease without esophagitis: Secondary | ICD-10-CM | POA: Diagnosis not present

## 2018-02-08 DIAGNOSIS — Z6832 Body mass index (BMI) 32.0-32.9, adult: Secondary | ICD-10-CM | POA: Diagnosis not present

## 2018-02-08 DIAGNOSIS — I10 Essential (primary) hypertension: Secondary | ICD-10-CM | POA: Diagnosis not present

## 2018-02-13 ENCOUNTER — Encounter (HOSPITAL_COMMUNITY): Payer: Self-pay | Admitting: Emergency Medicine

## 2018-02-13 ENCOUNTER — Emergency Department (HOSPITAL_COMMUNITY): Payer: Medicare HMO

## 2018-02-13 ENCOUNTER — Emergency Department (HOSPITAL_COMMUNITY)
Admission: EM | Admit: 2018-02-13 | Discharge: 2018-02-13 | Disposition: A | Discharge status: Home / Self Care | Payer: Medicare HMO | Attending: Emergency Medicine | Admitting: Emergency Medicine

## 2018-02-13 DIAGNOSIS — M25552 Pain in left hip: Secondary | ICD-10-CM | POA: Diagnosis not present

## 2018-02-13 DIAGNOSIS — M25559 Pain in unspecified hip: Secondary | ICD-10-CM | POA: Diagnosis not present

## 2018-02-13 DIAGNOSIS — R52 Pain, unspecified: Secondary | ICD-10-CM

## 2018-02-13 DIAGNOSIS — M79662 Pain in left lower leg: Secondary | ICD-10-CM | POA: Insufficient documentation

## 2018-02-13 DIAGNOSIS — S86912A Strain of unspecified muscle(s) and tendon(s) at lower leg level, left leg, initial encounter: Secondary | ICD-10-CM | POA: Diagnosis not present

## 2018-02-13 DIAGNOSIS — M79605 Pain in left leg: Secondary | ICD-10-CM

## 2018-02-13 DIAGNOSIS — T148XXA Other injury of unspecified body region, initial encounter: Secondary | ICD-10-CM

## 2018-02-13 MED ORDER — HYDROCODONE-ACETAMINOPHEN 5-325 MG PO TABS
2.0000 | ORAL_TABLET | Freq: Once | ORAL | Status: AC
  Administered 2018-02-13: 2 via ORAL
  Filled 2018-02-13: qty 2

## 2018-02-13 MED ORDER — METHOCARBAMOL 500 MG PO TABS: 500.0000 mg | ORAL_TABLET | Freq: Two times a day (BID) | ORAL | 0 refills | Status: DC

## 2018-02-13 NOTE — Discharge Instructions (Signed)
Your x-rays show no evidence of fracture, pain is likely due to a muscle strain in the back of your thigh.  Please take Tylenol as well as the prescribed muscle relaxer, this muscle relaxer can cause drowsiness, do not take before driving.  You can also use ice and heat, and over-the-counter muscle rubs such as Biofreeze, or salon pas lidocaine patches.  If symptoms are not improving over the next few days please call to schedule follow-up appointment with your primary care doctor.  Return if you have significantly worsened pain, redness or warmth over the leg, numbness or weakness or any other new or concerning symptoms.

## 2018-02-13 NOTE — ED Provider Notes (Signed)
[facility] EMERGENCY DEPARTMENT Provider Note   CSN: [id number] Arrival date & time: 02/13/18  1805     History   Chief Complaint Chief Complaint  Patient presents with  . Leg Pain    HPI Sean Marks is a 72 y.o. male. Complains of left posterior thigh pain sudden onset after he "pushed off" walking 4 PM today HPI is worse with walking and improved with remaining still.  No treatment prior to coming here.  Feels like a muscle pull.  Treatment prior to coming  No past medical history on file. Past medical history pretension, hypercholesterolemia. There are no active problems to display for this patient.         Home Medications    Prior to Admission medications   Not on File  Medications simvastatin, ramipril , omeprazole None Family History No family history on file.  Social History Social History   Tobacco Use  . Smoking status: Not on file  Substance Use Topics  . Alcohol use: Not on file  . Drug use: Not on file  No tobacco no alcohol no drug   Allergies   Patient has no known allergies.   Review of Systems Review of Systems  Constitutional: Negative.   Musculoskeletal: Positive for myalgias.       Pain at left posterior thigh  Psychiatric/Behavioral: Negative.      Physical Exam Updated Vital Signs Temp 97.9 F (36.6 C) (Oral)   Physical Exam Vitals signs and nursing note reviewed.  Constitutional:      Appearance: He is well-developed.  HENT:     Head: Normocephalic and atraumatic.  Eyes:     Conjunctiva/sclera: Conjunctivae normal.     Pupils: Pupils are equal, round, and reactive to light.  Neck:     Musculoskeletal: Neck supple.     Thyroid: No thyromegaly.     Trachea: No tracheal deviation.  Cardiovascular:     Rate and Rhythm: Normal rate and regular rhythm.     Heart sounds: No murmur.  Pulmonary:     Effort: Pulmonary effort is normal.     Breath sounds: Normal breath sounds.  Abdominal:   General: Bowel sounds are normal. There is no distension.     Palpations: Abdomen is soft.     Tenderness: There is no abdominal tenderness.  Musculoskeletal: Normal range of motion.        General: No tenderness.     Comments: Lower extremity without swelling or deformity or point tenderness he has pain at left buttock and left posterior proximal thigh with hip flexion.  All other extremities are redness swelling or tenderness neurovascular intact.  Skin:    General: Skin is warm and dry.     Findings: No rash.  Neurological:     Mental Status: He is alert.     Coordination: Coordination normal.     Comments: Normal gait      ED Treatments / Results  Labs (all labs ordered are listed, but only abnormal results are displayed) Labs Reviewed - No data to display  EKG None  Radiology No results found.  Procedures Procedures (including critical care time)  Medications Ordered in ED Medications - No data to display   Initial Impression / Assessment and Plan / ED Course  I have reviewed the triage vital signs and the nursing notes.  Pertinent labs & imaging results that were available during my care of the patient were reviewed by me and considered in my medical  decision making (see chart for details).     Plan I am going.  Ms. Marijean Bravo, [place] will check radiologic studies and disposition patient.  Muscle strain.  Final Clinical Impressions(s) / ED Diagnoses  Dx pain in left lower extremity Final diagnoses:  None    ED Discharge Orders    None       Orlie Dakin, MD 02/13/18 1925

## 2018-02-13 NOTE — ED Provider Notes (Signed)
Care assumed from Dr. Winfred Leeds, please see his note for full details, but in brief Sean Marks is a 72 y.o. male presents for evaluation of posterior left thigh pain after he pushed off while walking this afternoon.  Leg is neurovascularly intact without obvious deformity.  At shift change pending x-rays of the left hip and femur, if these are negative likely a muscle strain which can be treated with Tylenol, muscle relaxers and supportive care.  Plan: Follow-up on x-rays and reevaluate patient's pain.  Dg Pelvis 1-2 Views  Result Date: 02/13/2018 CLINICAL DATA:  Posterior hip pain EXAM: PELVIS - 1-2 VIEW COMPARISON:  CT 07/27/2009 FINDINGS: SI joints are non widened. Pubic symphysis and rami are intact. No fracture or malalignment. IMPRESSION: Negative. Electronically Signed   By: Donavan Foil M.D.   On: 02/13/2018 19:46   Dg Femur Min 2 Views Left  Result Date: 02/13/2018 CLINICAL DATA:  Posterior hip pain EXAM: LEFT FEMUR 2 VIEWS COMPARISON:  None. FINDINGS: No acute displaced fracture or malalignment. 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 Dystrophic calcification within the soft tissues inferior to the left proximal femur IMPRESSION: No acute osseous abnormality Electronically Signed   By: Donavan Foil M.D.   On: 02/13/2018 19:45   MDM  X-rays show no evidence of acute fracture.  Pain improved with treatment here in the ED.  Likely a muscle strain in the thigh. per Dr. Cathleen Fears will treat with Tylenol, muscle relaxers, ice and he also discussed over-the-counter muscle rubs and treatments.  Patient to follow-up with PCP if symptoms not improving.  Return precautions discussed.  Patient and wife expressed understanding and are in agreement with plan.  Stable for discharge home in good condition.  Final diagnoses:  Diffuse pain in left lower extremity  Muscle strain    ED Discharge Orders         Ordered    methocarbamol (ROBAXIN) 500 MG tablet  2 times daily     02/13/18 2023             Janet Berlin 02/13/18 2034    Orlie Dakin, MD 02/14/18 [id number]

## 2018-02-13 NOTE — ED Notes (Signed)
Triage changed and upgraded d/t triage guidelines provided by leadership age 71 or greater being a hard 3

## 2018-02-13 NOTE — ED Triage Notes (Signed)
Pt was cutting trees, limb fell and patient had to run out of the way around 1600. Pt co L calf and hamstring pain

## 2018-02-13 NOTE — ED Notes (Signed)
Pt stable, ambulatory, states understanding of discharge instructions 

## 2018-02-19 DIAGNOSIS — M170 Bilateral primary osteoarthritis of knee: Secondary | ICD-10-CM | POA: Diagnosis not present

## 2018-02-19 DIAGNOSIS — S76312A Strain of muscle, fascia and tendon of the posterior muscle group at thigh level, left thigh, initial encounter: Secondary | ICD-10-CM | POA: Insufficient documentation

## 2018-02-19 DIAGNOSIS — M1712 Unilateral primary osteoarthritis, left knee: Secondary | ICD-10-CM | POA: Insufficient documentation

## 2018-02-19 DIAGNOSIS — Z96651 Presence of right artificial knee joint: Secondary | ICD-10-CM | POA: Diagnosis not present

## 2018-02-19 HISTORY — DX: Unilateral primary osteoarthritis, left knee: M17.12

## 2018-02-19 HISTORY — DX: Strain of muscle, fascia and tendon of the posterior muscle group at thigh level, left thigh, initial encounter: S76.312A

## 2018-02-22 DIAGNOSIS — E785 Hyperlipidemia, unspecified: Secondary | ICD-10-CM | POA: Diagnosis not present

## 2018-02-22 DIAGNOSIS — I10 Essential (primary) hypertension: Secondary | ICD-10-CM | POA: Diagnosis not present

## 2018-02-22 DIAGNOSIS — I2510 Atherosclerotic heart disease of native coronary artery without angina pectoris: Secondary | ICD-10-CM | POA: Diagnosis not present

## 2018-02-22 DIAGNOSIS — I252 Old myocardial infarction: Secondary | ICD-10-CM | POA: Diagnosis not present

## 2018-02-22 DIAGNOSIS — K219 Gastro-esophageal reflux disease without esophagitis: Secondary | ICD-10-CM | POA: Diagnosis not present

## 2018-02-22 DIAGNOSIS — Z6831 Body mass index (BMI) 31.0-31.9, adult: Secondary | ICD-10-CM | POA: Diagnosis not present

## 2018-02-22 DIAGNOSIS — E291 Testicular hypofunction: Secondary | ICD-10-CM | POA: Diagnosis not present

## 2018-02-23 DIAGNOSIS — I2510 Atherosclerotic heart disease of native coronary artery without angina pectoris: Secondary | ICD-10-CM | POA: Diagnosis not present

## 2018-02-23 DIAGNOSIS — E785 Hyperlipidemia, unspecified: Secondary | ICD-10-CM | POA: Diagnosis not present

## 2018-03-02 DIAGNOSIS — S76312A Strain of muscle, fascia and tendon of the posterior muscle group at thigh level, left thigh, initial encounter: Secondary | ICD-10-CM | POA: Diagnosis not present

## 2018-03-05 DIAGNOSIS — I2510 Atherosclerotic heart disease of native coronary artery without angina pectoris: Secondary | ICD-10-CM | POA: Diagnosis not present

## 2018-03-05 DIAGNOSIS — I10 Essential (primary) hypertension: Secondary | ICD-10-CM | POA: Diagnosis not present

## 2018-03-05 DIAGNOSIS — Z6831 Body mass index (BMI) 31.0-31.9, adult: Secondary | ICD-10-CM | POA: Diagnosis not present

## 2018-03-05 DIAGNOSIS — E291 Testicular hypofunction: Secondary | ICD-10-CM | POA: Diagnosis not present

## 2018-03-05 DIAGNOSIS — E785 Hyperlipidemia, unspecified: Secondary | ICD-10-CM | POA: Diagnosis not present

## 2018-03-05 DIAGNOSIS — K219 Gastro-esophageal reflux disease without esophagitis: Secondary | ICD-10-CM | POA: Diagnosis not present

## 2018-03-05 DIAGNOSIS — I252 Old myocardial infarction: Secondary | ICD-10-CM | POA: Diagnosis not present

## 2018-03-19 DIAGNOSIS — E291 Testicular hypofunction: Secondary | ICD-10-CM | POA: Diagnosis not present

## 2018-03-19 DIAGNOSIS — K219 Gastro-esophageal reflux disease without esophagitis: Secondary | ICD-10-CM | POA: Diagnosis not present

## 2018-03-19 DIAGNOSIS — I252 Old myocardial infarction: Secondary | ICD-10-CM | POA: Diagnosis not present

## 2018-03-19 DIAGNOSIS — Z6831 Body mass index (BMI) 31.0-31.9, adult: Secondary | ICD-10-CM | POA: Diagnosis not present

## 2018-03-19 DIAGNOSIS — I10 Essential (primary) hypertension: Secondary | ICD-10-CM | POA: Diagnosis not present

## 2018-03-19 DIAGNOSIS — E785 Hyperlipidemia, unspecified: Secondary | ICD-10-CM | POA: Diagnosis not present

## 2018-03-19 DIAGNOSIS — I2510 Atherosclerotic heart disease of native coronary artery without angina pectoris: Secondary | ICD-10-CM | POA: Diagnosis not present

## 2018-03-19 DIAGNOSIS — E669 Obesity, unspecified: Secondary | ICD-10-CM | POA: Diagnosis not present

## 2018-03-29 DIAGNOSIS — I252 Old myocardial infarction: Secondary | ICD-10-CM | POA: Diagnosis not present

## 2018-03-29 DIAGNOSIS — E669 Obesity, unspecified: Secondary | ICD-10-CM | POA: Diagnosis not present

## 2018-03-29 DIAGNOSIS — E291 Testicular hypofunction: Secondary | ICD-10-CM | POA: Diagnosis not present

## 2018-03-29 DIAGNOSIS — K219 Gastro-esophageal reflux disease without esophagitis: Secondary | ICD-10-CM | POA: Diagnosis not present

## 2018-03-29 DIAGNOSIS — Z6831 Body mass index (BMI) 31.0-31.9, adult: Secondary | ICD-10-CM | POA: Diagnosis not present

## 2018-03-29 DIAGNOSIS — J208 Acute bronchitis due to other specified organisms: Secondary | ICD-10-CM | POA: Diagnosis not present

## 2018-03-29 DIAGNOSIS — E785 Hyperlipidemia, unspecified: Secondary | ICD-10-CM | POA: Diagnosis not present

## 2018-03-29 DIAGNOSIS — I10 Essential (primary) hypertension: Secondary | ICD-10-CM | POA: Diagnosis not present

## 2018-03-29 DIAGNOSIS — I2510 Atherosclerotic heart disease of native coronary artery without angina pectoris: Secondary | ICD-10-CM | POA: Diagnosis not present

## 2018-04-02 DIAGNOSIS — E291 Testicular hypofunction: Secondary | ICD-10-CM | POA: Diagnosis not present

## 2018-04-02 DIAGNOSIS — I252 Old myocardial infarction: Secondary | ICD-10-CM | POA: Diagnosis not present

## 2018-04-02 DIAGNOSIS — E669 Obesity, unspecified: Secondary | ICD-10-CM | POA: Diagnosis not present

## 2018-04-02 DIAGNOSIS — K219 Gastro-esophageal reflux disease without esophagitis: Secondary | ICD-10-CM | POA: Diagnosis not present

## 2018-04-02 DIAGNOSIS — E785 Hyperlipidemia, unspecified: Secondary | ICD-10-CM | POA: Diagnosis not present

## 2018-04-02 DIAGNOSIS — Z6831 Body mass index (BMI) 31.0-31.9, adult: Secondary | ICD-10-CM | POA: Diagnosis not present

## 2018-04-02 DIAGNOSIS — I2510 Atherosclerotic heart disease of native coronary artery without angina pectoris: Secondary | ICD-10-CM | POA: Diagnosis not present

## 2018-04-02 DIAGNOSIS — I10 Essential (primary) hypertension: Secondary | ICD-10-CM | POA: Diagnosis not present

## 2018-04-16 DIAGNOSIS — I252 Old myocardial infarction: Secondary | ICD-10-CM | POA: Diagnosis not present

## 2018-04-16 DIAGNOSIS — I10 Essential (primary) hypertension: Secondary | ICD-10-CM | POA: Diagnosis not present

## 2018-04-16 DIAGNOSIS — K219 Gastro-esophageal reflux disease without esophagitis: Secondary | ICD-10-CM | POA: Diagnosis not present

## 2018-04-16 DIAGNOSIS — Z6832 Body mass index (BMI) 32.0-32.9, adult: Secondary | ICD-10-CM | POA: Diagnosis not present

## 2018-04-16 DIAGNOSIS — I2510 Atherosclerotic heart disease of native coronary artery without angina pectoris: Secondary | ICD-10-CM | POA: Diagnosis not present

## 2018-04-16 DIAGNOSIS — L0390 Cellulitis, unspecified: Secondary | ICD-10-CM | POA: Diagnosis not present

## 2018-04-16 DIAGNOSIS — E291 Testicular hypofunction: Secondary | ICD-10-CM | POA: Diagnosis not present

## 2018-04-16 DIAGNOSIS — E785 Hyperlipidemia, unspecified: Secondary | ICD-10-CM | POA: Diagnosis not present

## 2018-04-26 DIAGNOSIS — H35373 Puckering of macula, bilateral: Secondary | ICD-10-CM | POA: Diagnosis not present

## 2018-04-26 DIAGNOSIS — H25813 Combined forms of age-related cataract, bilateral: Secondary | ICD-10-CM | POA: Diagnosis not present

## 2018-04-26 DIAGNOSIS — H35369 Drusen (degenerative) of macula, unspecified eye: Secondary | ICD-10-CM | POA: Diagnosis not present

## 2018-04-28 DIAGNOSIS — S32612D Displaced avulsion fracture of left ischium, subsequent encounter for fracture with routine healing: Secondary | ICD-10-CM | POA: Diagnosis not present

## 2018-06-01 DIAGNOSIS — E785 Hyperlipidemia, unspecified: Secondary | ICD-10-CM | POA: Diagnosis not present

## 2018-06-01 DIAGNOSIS — I252 Old myocardial infarction: Secondary | ICD-10-CM | POA: Diagnosis not present

## 2018-06-01 DIAGNOSIS — I2510 Atherosclerotic heart disease of native coronary artery without angina pectoris: Secondary | ICD-10-CM | POA: Diagnosis not present

## 2018-06-01 DIAGNOSIS — Z6831 Body mass index (BMI) 31.0-31.9, adult: Secondary | ICD-10-CM | POA: Diagnosis not present

## 2018-06-01 DIAGNOSIS — K219 Gastro-esophageal reflux disease without esophagitis: Secondary | ICD-10-CM | POA: Diagnosis not present

## 2018-06-01 DIAGNOSIS — E669 Obesity, unspecified: Secondary | ICD-10-CM | POA: Diagnosis not present

## 2018-06-01 DIAGNOSIS — I10 Essential (primary) hypertension: Secondary | ICD-10-CM | POA: Diagnosis not present

## 2018-06-01 DIAGNOSIS — E291 Testicular hypofunction: Secondary | ICD-10-CM | POA: Diagnosis not present

## 2018-07-30 ENCOUNTER — Encounter: Payer: Self-pay | Admitting: Specialist

## 2018-07-30 ENCOUNTER — Other Ambulatory Visit: Payer: Self-pay

## 2018-07-30 ENCOUNTER — Ambulatory Visit: Payer: Medicare HMO

## 2018-07-30 ENCOUNTER — Ambulatory Visit: Payer: Medicare HMO | Admitting: Specialist

## 2018-07-30 VITALS — BP 94/57 | HR 71 | Ht 71.0 in | Wt 228.0 lb

## 2018-07-30 DIAGNOSIS — M5137 Other intervertebral disc degeneration, lumbosacral region: Secondary | ICD-10-CM | POA: Diagnosis not present

## 2018-07-30 DIAGNOSIS — M545 Low back pain, unspecified: Secondary | ICD-10-CM

## 2018-07-30 DIAGNOSIS — M4815 Ankylosing hyperostosis [Forestier], thoracolumbar region: Secondary | ICD-10-CM

## 2018-07-30 DIAGNOSIS — M47816 Spondylosis without myelopathy or radiculopathy, lumbar region: Secondary | ICD-10-CM | POA: Diagnosis not present

## 2018-07-30 MED ORDER — DICLOFENAC POTASSIUM 50 MG PO TABS: 50.0000 mg | ORAL_TABLET | Freq: Two times a day (BID) | ORAL | 3 refills | Status: DC

## 2018-07-30 MED ORDER — GABAPENTIN 100 MG PO CAPS: 100.0000 mg | ORAL_CAPSULE | Freq: Every day | ORAL | 3 refills | Status: DC

## 2018-07-30 NOTE — Patient Instructions (Addendum)
Avoid bending, stooping and avoid lifting weights greater than 10 lbs. Avoid prolong standing and walking. Avoid frequent bending and stooping  No lifting greater than 10 lbs. May use ice or moist heat for pain. Weight loss is of benefit. Handicap license is approved. Start cataflam/diclofenac 50 mg up to twice a day. Gabapentin 100mg  po at night.  Flexion exercises and pool walking.

## 2018-07-30 NOTE — Progress Notes (Signed)
Office Visit Note   Patient: Sean Marks           Date of Birth: 1945-12-13           MRN: [id number] Visit Date: 07/30/2018              Requested by: No referring provider defined for this encounter. PCP: System, Provider Not In   Assessment & Plan: Visit Diagnoses:  1. Low back pain, unspecified back pain laterality, unspecified chronicity, unspecified whether sciatica present   2. 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 Spondylosis without myelopathy or radiculopathy, lumbar region   4. DDD (degenerative disc disease), lumbosacral     Plan: Avoid bending, stooping and avoid lifting weights greater than 10 lbs. Avoid prolong standing and walking. Avoid frequent bending and stooping  No lifting greater than 10 lbs. May use ice or moist heat for pain. Weight loss is of benefit. Handicap license is approved. Start cataflam/diclofenac 50 mg up to twice a day. Gabapentin 100mg  po at night.  Flexion exercises and pool walking  Follow-Up Instructions: No follow-ups on file.   Orders:  Orders Placed This Encounter  Procedures  . XR Lumbar Spine 2-3 Views   Meds ordered this encounter  Medications  . diclofenac (CATAFLAM) 50 MG tablet    Sig: Take 1 tablet (50 mg total) by mouth 2 (two) times daily.    Dispense:  60 tablet    Refill:  3  . gabapentin (NEURONTIN) 100 MG capsule    Sig: Take 1 capsule (100 mg total) by mouth at bedtime.    Dispense:  30 capsule    Refill:  3      Procedures: No procedures performed   Clinical Data: No additional findings.   Subjective: Chief Complaint  Patient presents with  . Lower Back - Pain    73 year old male with history of lumbar disc herniation and emergency surgery in 12/2010. He did well with that procedure. Has noted the Worsening of left low back pain and this is worsened with bend and stoop and with walking. The pain is 90 % back left sided and pain with sitting long periods. 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 He then went to [place] and then to [employer] at home and farmed and then  [employer] to [employer]. There is no leg numbness or tingling and no weakness, It hurts his back to go from sitting to standing and is changing position al the time. After lying still for a while he feels able to go to sleep.    Review of Systems  Constitutional: Negative.   HENT: Negative.   Eyes: Negative.   Respiratory: Negative.   Cardiovascular: Negative.   Gastrointestinal: Negative.   Endocrine: Negative.   Genitourinary: Negative.   Musculoskeletal: Negative.   Skin: Negative.   Allergic/Immunologic: Negative.   Neurological: Negative.   Hematological: Negative.   Psychiatric/Behavioral: Negative.      Objective: Vital Signs: BP (!) 94/57 (BP Location: Left Arm, Patient Position: Sitting)   Pulse 71   Ht 5\' 11"  (1.803 m)   Wt 228 lb (103.4 kg)   BMI 31.80 kg/m   Physical Exam Constitutional:      Appearance: He is well-developed.  HENT:     Head: Normocephalic and atraumatic.  Eyes:     Pupils:  Pupils are equal, round, and reactive to light.  Neck:     Musculoskeletal: Normal range of motion and neck supple.  Pulmonary:     Effort: Pulmonary effort is normal.     Breath sounds: Normal breath sounds.  Abdominal:     General: Bowel sounds are normal.     Palpations: Abdomen is soft.  Skin:    General: Skin is warm and dry.  Neurological:     Mental Status: He is alert and oriented to person, place, and time.  Psychiatric:        Behavior: Behavior normal.        Thought Content: Thought content normal.        Judgment: Judgment normal.     Back Exam   Tenderness  The patient is experiencing tenderness in the lumbar.  Range of Motion  Extension:  10 abnormal  Flexion:  70 abnormal  Lateral bend right: abnormal   Lateral bend left: abnormal  Rotation right: abnormal  Rotation left: abnormal   Muscle Strength  Right Quadriceps:  5/5  Left Quadriceps:  5/5  Right Hamstrings:  5/5  Left Hamstrings:  5/5   Tests  Straight leg raise right: negative Straight leg raise left: negative  Reflexes  Patellar: 0/4 Achilles: 0/4 Babinski's sign: normal   Other  Toe walk: normal Heel walk: normal Sensation: normal Gait: normal  Erythema: no back redness Scars: absent  Comments:  Right TKR, Dr.[facility]ern[facility]le,  Bilateral achilles tendon repair in the pain.      Specialty Comments:  No specialty comments available.  Imaging: Xr Lumbar Spine 2-3 Views  Result Date: 07/30/2018 AP and lateral flexion and extension radiographs show diffuse spondylosis changes with DDD L4-5 and L5-S1 greater than the remainder anterior osteophytes over the lower thoracolumbar spine and upper lumbar spine and bilateral Disc laterally From L1-2 through L4-5. No anterolisthesis, There is endplate changes at Y1-8. MRI from 03/2017 lumbar spine with foramenal stenosis L5-S1 bilateral with disc protrusion left L2-3 with lateral recess stenosis and left L3 nerve Compression, L3-4 bilateral lateral recess stenosis that is mild and foramenal narrowing moderate left and mild right.     PMFS History: There are no active problems to display for this patient.  History reviewed. No pertinent past medical history.  History reviewed. No pertinent family history.  History reviewed. No pertinent surgical history. Social History   Occupational History  . Not on file  Tobacco Use  . Smoking status: Never Smoker  . Smokeless tobacco: Never Used  Substance and Sexual Activity  . Alcohol use: Never    Frequency: Never  . Drug use: Never  . Sexual activity: Not on file

## 2018-08-10 DIAGNOSIS — Z6831 Body mass index (BMI) 31.0-31.9, adult: Secondary | ICD-10-CM | POA: Diagnosis not present

## 2018-08-10 DIAGNOSIS — Z125 Encounter for screening for malignant neoplasm of prostate: Secondary | ICD-10-CM | POA: Diagnosis not present

## 2018-08-10 DIAGNOSIS — Z9181 History of falling: Secondary | ICD-10-CM | POA: Diagnosis not present

## 2018-08-10 DIAGNOSIS — E785 Hyperlipidemia, unspecified: Secondary | ICD-10-CM | POA: Diagnosis not present

## 2018-08-10 DIAGNOSIS — Z1331 Encounter for screening for depression: Secondary | ICD-10-CM | POA: Diagnosis not present

## 2018-08-10 DIAGNOSIS — Z0000 Encounter for general adult medical examination without abnormal findings: Secondary | ICD-10-CM | POA: Diagnosis not present

## 2018-08-10 DIAGNOSIS — Z1211 Encounter for screening for malignant neoplasm of colon: Secondary | ICD-10-CM | POA: Diagnosis not present

## 2018-08-20 DIAGNOSIS — I252 Old myocardial infarction: Secondary | ICD-10-CM | POA: Diagnosis not present

## 2018-08-20 DIAGNOSIS — E291 Testicular hypofunction: Secondary | ICD-10-CM | POA: Diagnosis not present

## 2018-08-20 DIAGNOSIS — I2510 Atherosclerotic heart disease of native coronary artery without angina pectoris: Secondary | ICD-10-CM | POA: Diagnosis not present

## 2018-08-20 DIAGNOSIS — M25511 Pain in right shoulder: Secondary | ICD-10-CM | POA: Diagnosis not present

## 2018-08-20 DIAGNOSIS — Z6830 Body mass index (BMI) 30.0-30.9, adult: Secondary | ICD-10-CM | POA: Diagnosis not present

## 2018-08-20 DIAGNOSIS — E669 Obesity, unspecified: Secondary | ICD-10-CM | POA: Diagnosis not present

## 2018-08-20 DIAGNOSIS — K219 Gastro-esophageal reflux disease without esophagitis: Secondary | ICD-10-CM | POA: Diagnosis not present

## 2018-08-20 DIAGNOSIS — I10 Essential (primary) hypertension: Secondary | ICD-10-CM | POA: Diagnosis not present

## 2018-08-20 DIAGNOSIS — E785 Hyperlipidemia, unspecified: Secondary | ICD-10-CM | POA: Diagnosis not present

## 2018-08-26 ENCOUNTER — Other Ambulatory Visit: Payer: Self-pay | Admitting: Specialist

## 2018-08-30 DIAGNOSIS — E669 Obesity, unspecified: Secondary | ICD-10-CM | POA: Diagnosis not present

## 2018-08-30 DIAGNOSIS — Z139 Encounter for screening, unspecified: Secondary | ICD-10-CM | POA: Diagnosis not present

## 2018-08-30 DIAGNOSIS — Z6829 Body mass index (BMI) 29.0-29.9, adult: Secondary | ICD-10-CM | POA: Diagnosis not present

## 2018-08-30 DIAGNOSIS — I252 Old myocardial infarction: Secondary | ICD-10-CM | POA: Diagnosis not present

## 2018-08-30 DIAGNOSIS — E785 Hyperlipidemia, unspecified: Secondary | ICD-10-CM | POA: Diagnosis not present

## 2018-08-30 DIAGNOSIS — K219 Gastro-esophageal reflux disease without esophagitis: Secondary | ICD-10-CM | POA: Diagnosis not present

## 2018-08-30 DIAGNOSIS — I2510 Atherosclerotic heart disease of native coronary artery without angina pectoris: Secondary | ICD-10-CM | POA: Diagnosis not present

## 2018-08-30 DIAGNOSIS — E291 Testicular hypofunction: Secondary | ICD-10-CM | POA: Diagnosis not present

## 2018-08-30 DIAGNOSIS — I10 Essential (primary) hypertension: Secondary | ICD-10-CM | POA: Diagnosis not present

## 2018-09-17 DIAGNOSIS — E785 Hyperlipidemia, unspecified: Secondary | ICD-10-CM | POA: Diagnosis not present

## 2018-09-17 DIAGNOSIS — Z955 Presence of coronary angioplasty implant and graft: Secondary | ICD-10-CM | POA: Insufficient documentation

## 2018-09-17 DIAGNOSIS — H524 Presbyopia: Secondary | ICD-10-CM | POA: Diagnosis not present

## 2018-09-17 DIAGNOSIS — H52209 Unspecified astigmatism, unspecified eye: Secondary | ICD-10-CM | POA: Diagnosis not present

## 2018-09-17 DIAGNOSIS — I2510 Atherosclerotic heart disease of native coronary artery without angina pectoris: Secondary | ICD-10-CM | POA: Diagnosis not present

## 2018-09-17 DIAGNOSIS — H5203 Hypermetropia, bilateral: Secondary | ICD-10-CM | POA: Diagnosis not present

## 2018-09-17 HISTORY — DX: Presence of coronary angioplasty implant and graft: Z95.5

## 2018-11-05 DIAGNOSIS — X32XXXA Exposure to sunlight, initial encounter: Secondary | ICD-10-CM | POA: Diagnosis not present

## 2018-11-05 DIAGNOSIS — L570 Actinic keratosis: Secondary | ICD-10-CM | POA: Diagnosis not present

## 2018-11-05 DIAGNOSIS — D485 Neoplasm of uncertain behavior of skin: Secondary | ICD-10-CM | POA: Diagnosis not present

## 2018-11-05 DIAGNOSIS — L821 Other seborrheic keratosis: Secondary | ICD-10-CM | POA: Diagnosis not present

## 2018-11-10 DIAGNOSIS — M25512 Pain in left shoulder: Secondary | ICD-10-CM | POA: Diagnosis not present

## 2018-12-10 DIAGNOSIS — Z6829 Body mass index (BMI) 29.0-29.9, adult: Secondary | ICD-10-CM | POA: Diagnosis not present

## 2018-12-10 DIAGNOSIS — I2510 Atherosclerotic heart disease of native coronary artery without angina pectoris: Secondary | ICD-10-CM | POA: Diagnosis not present

## 2018-12-10 DIAGNOSIS — I10 Essential (primary) hypertension: Secondary | ICD-10-CM | POA: Diagnosis not present

## 2018-12-10 DIAGNOSIS — I252 Old myocardial infarction: Secondary | ICD-10-CM | POA: Diagnosis not present

## 2018-12-10 DIAGNOSIS — E291 Testicular hypofunction: Secondary | ICD-10-CM | POA: Diagnosis not present

## 2018-12-10 DIAGNOSIS — E669 Obesity, unspecified: Secondary | ICD-10-CM | POA: Diagnosis not present

## 2018-12-10 DIAGNOSIS — Z23 Encounter for immunization: Secondary | ICD-10-CM | POA: Diagnosis not present

## 2018-12-10 DIAGNOSIS — K219 Gastro-esophageal reflux disease without esophagitis: Secondary | ICD-10-CM | POA: Diagnosis not present

## 2018-12-10 DIAGNOSIS — E785 Hyperlipidemia, unspecified: Secondary | ICD-10-CM | POA: Diagnosis not present

## 2019-03-02 DIAGNOSIS — I252 Old myocardial infarction: Secondary | ICD-10-CM | POA: Diagnosis not present

## 2019-03-02 DIAGNOSIS — E785 Hyperlipidemia, unspecified: Secondary | ICD-10-CM | POA: Diagnosis not present

## 2019-03-02 DIAGNOSIS — M159 Polyosteoarthritis, unspecified: Secondary | ICD-10-CM | POA: Diagnosis not present

## 2019-03-02 DIAGNOSIS — E663 Overweight: Secondary | ICD-10-CM | POA: Diagnosis not present

## 2019-03-02 DIAGNOSIS — Z6829 Body mass index (BMI) 29.0-29.9, adult: Secondary | ICD-10-CM | POA: Diagnosis not present

## 2019-03-02 DIAGNOSIS — I2510 Atherosclerotic heart disease of native coronary artery without angina pectoris: Secondary | ICD-10-CM | POA: Diagnosis not present

## 2019-03-02 DIAGNOSIS — E291 Testicular hypofunction: Secondary | ICD-10-CM | POA: Diagnosis not present

## 2019-03-02 DIAGNOSIS — I10 Essential (primary) hypertension: Secondary | ICD-10-CM | POA: Diagnosis not present

## 2019-03-02 DIAGNOSIS — K219 Gastro-esophageal reflux disease without esophagitis: Secondary | ICD-10-CM | POA: Diagnosis not present

## 2019-03-07 ENCOUNTER — Ambulatory Visit: Payer: Medicare HMO | Admitting: Family Medicine

## 2019-03-17 DIAGNOSIS — K219 Gastro-esophageal reflux disease without esophagitis: Secondary | ICD-10-CM | POA: Diagnosis not present

## 2019-03-17 DIAGNOSIS — E785 Hyperlipidemia, unspecified: Secondary | ICD-10-CM | POA: Diagnosis not present

## 2019-03-17 DIAGNOSIS — M159 Polyosteoarthritis, unspecified: Secondary | ICD-10-CM | POA: Diagnosis not present

## 2019-03-17 DIAGNOSIS — I10 Essential (primary) hypertension: Secondary | ICD-10-CM | POA: Diagnosis not present

## 2019-03-17 DIAGNOSIS — E663 Overweight: Secondary | ICD-10-CM | POA: Diagnosis not present

## 2019-03-17 DIAGNOSIS — I252 Old myocardial infarction: Secondary | ICD-10-CM | POA: Diagnosis not present

## 2019-03-17 DIAGNOSIS — E291 Testicular hypofunction: Secondary | ICD-10-CM | POA: Diagnosis not present

## 2019-03-17 DIAGNOSIS — I2510 Atherosclerotic heart disease of native coronary artery without angina pectoris: Secondary | ICD-10-CM | POA: Diagnosis not present

## 2019-03-17 DIAGNOSIS — Z6829 Body mass index (BMI) 29.0-29.9, adult: Secondary | ICD-10-CM | POA: Diagnosis not present

## 2019-03-24 ENCOUNTER — Encounter: Payer: Self-pay | Admitting: Cardiology

## 2019-03-24 NOTE — Progress Notes (Signed)
Cardiology Office Note:    Date:  03/25/2019   ID:  Sean Marks, DOB [birth year], MRN [id number]  PCP:  No primary care provider on file.  Cardiologist:  Shirlee More, MD   Referring MD: No ref. provider found  ASSESSMENT:    1. Coronary artery disease involving native coronary artery of native heart with angina pectoris ([place])   2. Mixed hyperlipidemia   3. Essential hypertension   4. Sinus bradycardia    PLAN:    In order of problems listed above:  1. Stable continue current medical treatment [place] Heart Association class I this time I would not advise an ischemia evaluation 2. Stable lipids at target most recent labs 0 03/02/2019 cholesterol 175 HDL 41 LDL 54 3. BP at target continue ACE inhibitor 4. Stable heart rate is adequate not on beta-blocker  Next appointment 6 months   Medication Adjustments/Labs and Tests Ordered: Current medicines are reviewed at length with the patient today.  Concerns regarding medicines are outlined above.  No orders of the defined types were placed in this encounter.  No orders of the defined types were placed in this encounter.    Chief Complaint  Patient presents with  . Coronary Artery Disease    To reestablish care    History of Present Illness:    Sean Marks is a 74 y.o. male with a history of CAD previous PCI and flex coronary artery hypertension and dyslipidemia who is being seen today to reestablish cardiology care last seen by me at [facility] cardiology 05/28/2016 at the request of the patient  Is nice to see Mr. Tait again I have cared for him for years.  Since last seen by me he has had no recurrent cardiac events he is retired has a good quality of life is vigorous and active.  He has had no angina shortness of breath palpitation or syncope he is compliant with his cardiac medications and he has no muscle weakness or pain from his statin.  Does not have nitroglycerin with COVID-19 will give him a prescription to avoid  ED visits.  Past Medical History:  Diagnosis Date  . Asthma   . Coronary artery disease   . Hyperlipidemia   . Hypertension     Past Surgical History:  Procedure Laterality Date  . BACK SURGERY    . KNEE SURGERY      Current Medications: Current Meds  Medication Sig  . aspirin EC 81 MG tablet Take 81 mg by mouth every other day.  . Flaxseed, Linseed, (FLAX SEED OIL PO) Take 1 capsule by mouth daily.  . Omega-3 Fatty Acids (FISH OIL PO) Take 1 capsule by mouth daily.  Marland Kitchen omeprazole (PRILOSEC) 40 MG capsule Take 40 mg by mouth daily.  . ramipril (ALTACE) 5 MG capsule Take 5 mg by mouth every evening.   . simvastatin (ZOCOR) 40 MG tablet Take 40 mg by mouth at bedtime.  . [DISCONTINUED] acetaminophen (TYLENOL) 325 MG tablet Take 325-650 mg by mouth every 6 (six) hours as needed for mild pain or headache.  . [DISCONTINUED] diclofenac (CATAFLAM) 50 MG tablet Take 1 tablet (50 mg total) by mouth 2 (two) times daily.  . [DISCONTINUED] gabapentin (NEURONTIN) 100 MG capsule Take 1 capsule (100 mg total) by mouth at bedtime.  . [DISCONTINUED] methocarbamol (ROBAXIN) 500 MG tablet Take 1 tablet (500 mg total) by mouth 2 (two) times daily.  . [DISCONTINUED] naproxen sodium (ALEVE) 220 MG tablet Take 220-440 mg by mouth  2 (two) times daily as needed (for pain or headaches).     Allergies:   Oxycodone   Social History   Socioeconomic History  . Marital status: Married    Spouse name: Not on file  . Number of children: Not on file  . Years of education: Not on file  . Highest education level: Not on file  Occupational History  . Not on file  Tobacco Use  . Smoking status: Former Research scientist (life sciences)  . Smokeless tobacco: Never Used  Substance and Sexual Activity  . Alcohol use: Never  . Drug use: Never  . Sexual activity: Not on file  Other Topics Concern  . Not on file  Social History Narrative  . Not on file   Social Determinants of Health   Financial Resource Strain:   . Difficulty of  Paying Living Expenses: Not on file  Food Insecurity:   . Worried About Charity fundraiser in the Last Year: Not on file  . Ran Out of Food in the Last Year: Not on file  Transportation Needs:   . Lack of Transportation (Medical): Not on file  . Lack of Transportation (Non-Medical): Not on file  Physical Activity:   . Days of Exercise per Week: Not on file  . Minutes of Exercise per Session: Not on file  Stress:   . Feeling of Stress : Not on file  Social Connections:   . Frequency of Communication with Friends and Family: Not on file  . Frequency of Social Gatherings with Friends and Family: Not on file  . Attends Religious Services: Not on file  . Active Member of Clubs or Organizations: Not on file  . Attends Archivist Meetings: Not on file  . Marital Status: Not on file     Family History: CAD father 2 brothers ROS:   Review of Systems  Constitution: Negative.  HENT: Negative.   Eyes: Negative.   Cardiovascular: Negative.   Respiratory: Negative.   Endocrine: Negative.   Hematologic/Lymphatic: Negative.   Skin: Negative.   Musculoskeletal: Negative.   Gastrointestinal: Negative.   Genitourinary: Negative.   Neurological: Negative.   Psychiatric/Behavioral: Negative.   Allergic/Immunologic: Negative.    Please see the history of present illness.     All other systems reviewed and are negative.  EKGs/Labs/Other Studies Reviewed:    The following studies were reviewed today:   EKG:  EKG is  ordered today.  The ekg ordered today is personally reviewed and demonstrates rhythm normal EKG except for 1 APC  Recent Labs: No results found for requested labs within last 8760 hours.  Recent Lipid Panel No results found for: CHOL, TRIG, HDL, CHOLHDL, VLDL, LDLCALC, LDLDIRECT  Physical Exam:    VS:  BP 110/70   Pulse 67   Ht 5\' 11"  (1.803 m)   Wt 209 lb (94.8 kg)   SpO2 96%   BMI 29.15 kg/m     Wt Readings from Last 3 Encounters:  03/25/19 209 lb  (94.8 kg)  07/30/18 228 lb (103.4 kg)  02/13/18 220 lb (99.8 kg)     GEN:  Well nourished, well developed in no acute distress HEENT: Normal NECK: No JVD; No carotid bruits LYMPHATICS: No lymphadenopathy CARDIAC: RRR, no murmurs, rubs, gallops RESPIRATORY:  Clear to auscultation without rales, wheezing or rhonchi  ABDOMEN: Soft, non-tender, non-distended MUSCULOSKELETAL:  No edema; No deformity  SKIN: Warm and dry NEUROLOGIC:  Alert and oriented x 3 PSYCHIATRIC:  Normal affect  Signed, Shirlee More, MD  03/25/2019 9:41 AM    [facility]

## 2019-03-25 ENCOUNTER — Ambulatory Visit: Payer: Medicare HMO | Admitting: Cardiology

## 2019-03-25 ENCOUNTER — Encounter: Payer: Self-pay | Admitting: Cardiology

## 2019-03-25 ENCOUNTER — Other Ambulatory Visit: Payer: Self-pay

## 2019-03-25 VITALS — BP 110/70 | HR 67 | Ht 71.0 in | Wt 209.0 lb

## 2019-03-25 DIAGNOSIS — E782 Mixed hyperlipidemia: Secondary | ICD-10-CM | POA: Diagnosis not present

## 2019-03-25 DIAGNOSIS — I25119 Atherosclerotic heart disease of native coronary artery with unspecified angina pectoris: Secondary | ICD-10-CM

## 2019-03-25 DIAGNOSIS — R001 Bradycardia, unspecified: Secondary | ICD-10-CM

## 2019-03-25 DIAGNOSIS — I10 Essential (primary) hypertension: Secondary | ICD-10-CM | POA: Diagnosis not present

## 2019-03-25 MED ORDER — FISH OIL 1000 MG PO CAPS: 1.0000 | ORAL_CAPSULE | Freq: Two times a day (BID) | ORAL | 1 refills | Status: AC

## 2019-03-25 MED ORDER — RAMIPRIL 5 MG PO CAPS: 5.0000 mg | ORAL_CAPSULE | Freq: Every evening | ORAL | 1 refills | Status: DC

## 2019-03-25 MED ORDER — SIMVASTATIN 40 MG PO TABS: 40.0000 mg | ORAL_TABLET | Freq: Every day | ORAL | 1 refills | Status: DC

## 2019-03-25 MED ORDER — NITROGLYCERIN 0.4 MG SL SUBL: 0.4000 mg | SUBLINGUAL_TABLET | SUBLINGUAL | 3 refills | Status: DC | PRN

## 2019-03-25 MED ORDER — ASPIRIN EC 81 MG PO TBEC: 81.0000 mg | DELAYED_RELEASE_TABLET | ORAL | 2 refills | Status: AC

## 2019-03-25 NOTE — Patient Instructions (Signed)
Medication Instructions:  Your physician has recommended you make the following change in your medication:   START taking nitroglycerin as needed for chest pain, When having chest pain, stop what you are doing and sit down. Take 1 nitro, wait 5 minutes. Still having chest pain, take 1 nitro, wait 5 minutes. Still having chest pain, take 1 nitro, dial 911. Total of 3 nitro in 15 minutes.  *If you need a refill on your cardiac medications before your next appointment, please call your pharmacy*  Lab Work: NONE If you have labs (blood work) drawn today and your tests are completely normal, you will receive your results only by: Marland Kitchen MyChart Message (if you have MyChart) OR . A paper copy in the mail If you have any lab test that is abnormal or we need to change your treatment, we will call you to review the results.  Testing/Procedures: You had an EKG performed today   Follow-Up: At [facility], you and your health needs are our priority.  As part of our continuing mission to provide you with exceptional heart care, we have created designated Provider Care Teams.  These Care Teams include your primary Cardiologist (physician) and Advanced Practice Providers (APPs -  Physician Assistants and Nurse Practitioners) who all work together to provide you with the care you need, when you need it.  Your next appointment:   6 month(s)  The format for your next appointment:   In Person  Provider:   Shirlee More, MD  Other Instructions Nitroglycerin sublingual tablets What is this medicine? NITROGLYCERIN (nye troe GLI ser in) is a type of vasodilator. It relaxes blood vessels, increasing the blood and oxygen supply to your heart. This medicine is used to relieve chest pain caused by angina. It is also used to prevent chest pain before activities like climbing stairs, going outdoors in cold weather, or sexual activity. This medicine may be used for other purposes; ask your health care provider or  pharmacist if you have questions. COMMON BRAND NAME(S): Nitroquick, Nitrostat, Nitrotab What should I tell my health care provider before I take this medicine? They need to know if you have any of these conditions:  anemia  head injury, recent stroke, or bleeding in the brain  liver disease  previous heart attack  an unusual or allergic reaction to nitroglycerin, other medicines, foods, dyes, or preservatives  pregnant or trying to get pregnant  breast-feeding How should I use this medicine? Take this medicine by mouth as needed. At the first sign of an angina attack (chest pain or tightness) place one tablet under your tongue. You can also take this medicine 5 to 10 minutes before an event likely to produce chest pain. Follow the directions on the prescription label. Let the tablet dissolve under the tongue. Do not swallow whole. Replace the dose if you accidentally swallow it. It will help if your mouth is not dry. Saliva around the tablet will help it to dissolve more quickly. Do not eat or drink, smoke or chew tobacco while a tablet is dissolving. If you are not better within 5 minutes after taking ONE dose of nitroglycerin, call 9-1-1 immediately to seek emergency medical care. Do not take more than 3 nitroglycerin tablets over 15 minutes. If you take this medicine often to relieve symptoms of angina, your doctor or health care professional may provide you with different instructions to manage your symptoms. If symptoms do not go away after following these instructions, it is important to call 9-1-1  immediately. Do not take more than 3 nitroglycerin tablets over 15 minutes. Talk to your pediatrician regarding the use of this medicine in children. Special care may be needed. Overdosage: If you think you have taken too much of this medicine contact a poison control center or emergency room at once. NOTE: This medicine is only for you. Do not share this medicine with others. What if I miss  a dose? This does not apply. This medicine is only used as needed. What may interact with this medicine? Do not take this medicine with any of the following medications:  certain migraine medicines like ergotamine and dihydroergotamine (DHE)  medicines used to treat erectile dysfunction like sildenafil, tadalafil, and vardenafil  riociguat This medicine may also interact with the following medications:  alteplase  aspirin  heparin  medicines for high blood pressure  medicines for mental depression  other medicines used to treat angina  phenothiazines like chlorpromazine, mesoridazine, prochlorperazine, thioridazine This list may not describe all possible interactions. Give your health care provider a list of all the medicines, herbs, non-prescription drugs, or dietary supplements you use. Also tell them if you smoke, drink alcohol, or use illegal drugs. Some items may interact with your medicine. What should I watch for while using this medicine? Tell your doctor or health care professional if you feel your medicine is no longer working. Keep this medicine with you at all times. Sit or lie down when you take your medicine to prevent falling if you feel dizzy or faint after using it. Try to remain calm. This will help you to feel better faster. If you feel dizzy, take several deep breaths and lie down with your feet propped up, or bend forward with your head resting between your knees. You may get drowsy or dizzy. Do not drive, use machinery, or do anything that needs mental alertness until you know how this drug affects you. Do not stand or sit up quickly, especially if you are an older patient. This reduces the risk of dizzy or fainting spells. Alcohol can make you more drowsy and dizzy. Avoid alcoholic drinks. Do not treat yourself for coughs, colds, or pain while you are taking this medicine without asking your doctor or health care professional for advice. Some ingredients may  increase your blood pressure. What side effects may I notice from receiving this medicine? Side effects that you should report to your doctor or health care professional as soon as possible:  blurred vision  dry mouth  skin rash  sweating  the feeling of extreme pressure in the head  unusually weak or tired Side effects that usually do not require medical attention (report to your doctor or health care professional if they continue or are bothersome):  flushing of the face or neck  headache  irregular heartbeat, palpitations  nausea, vomiting This list may not describe all possible side effects. Call your doctor for medical advice about side effects. You may report side effects to FDA at 1-800-FDA-1088. Where should I keep my medicine? Keep out of the reach of children. Store at room temperature between 20 and 25 degrees C (68 and 77 degrees F). Store in [employer]. Protect from light and moisture. Keep tightly closed. Throw away any unused medicine after the expiration date. NOTE: This sheet is a summary. It may not cover all possible information. If you have questions about this medicine, talk to your doctor, pharmacist, or health care provider.  2020 Elsevier/Gold Standard (2012-12-09 17:57:36)

## 2019-04-13 ENCOUNTER — Ambulatory Visit: Payer: Self-pay

## 2019-04-13 ENCOUNTER — Ambulatory Visit: Payer: Medicare HMO | Admitting: Surgery

## 2019-04-13 ENCOUNTER — Telehealth: Payer: Self-pay

## 2019-04-13 ENCOUNTER — Encounter: Payer: Self-pay | Admitting: Surgery

## 2019-04-13 ENCOUNTER — Other Ambulatory Visit: Payer: Self-pay

## 2019-04-13 DIAGNOSIS — M545 Low back pain, unspecified: Secondary | ICD-10-CM

## 2019-04-13 DIAGNOSIS — M4726 Other spondylosis with radiculopathy, lumbar region: Secondary | ICD-10-CM

## 2019-04-13 MED ORDER — KETOROLAC TROMETHAMINE 30 MG/ML IJ SOLN: 30.0000 mg | Freq: Once | INTRAMUSCULAR | Status: AC

## 2019-04-13 MED ORDER — METHYLPREDNISOLONE ACETATE 80 MG/ML IJ SUSP: 80.0000 mg | Freq: Once | INTRAMUSCULAR | Status: AC

## 2019-04-13 MED ORDER — METHOCARBAMOL 500 MG PO TABS: 500.0000 mg | ORAL_TABLET | Freq: Three times a day (TID) | ORAL | 0 refills | Status: DC | PRN

## 2019-04-13 MED ORDER — METHYLPREDNISOLONE 4 MG PO TABS: ORAL_TABLET | ORAL | 0 refills | Status: DC

## 2019-04-13 NOTE — Telephone Encounter (Signed)
Patient was here to see Benjiman Core for back pain. Depo injection given left buttock. Toradol injection given right buttock as requested by Jeneen Rinks. He will call us back in 2 weeks to see how he is doing.

## 2019-04-13 NOTE — Progress Notes (Signed)
Office Visit Note   Patient: Sean Marks           Date of Birth: 01/11/1946           MRN: [id number] Visit Date: 04/13/2019              Requested by: No referring provider defined for this encounter. PCP: Cher Nakai, MD   Assessment & Plan: Visit Diagnoses:  1. Low back pain, unspecified back pain laterality, unspecified chronicity, unspecified whether sciatica present   2. Other spondylosis with radiculopathy, lumbar region     Plan: With patient's acute flare of low back pain and left lower extremity radiculopathy I will attempt conservative treatment.  Today patient was given Depo-Medrol 80 mg and Toradol 30 mg IM injections.  Sent in prescriptions for Medrol Dosepak 6-day taper which she will start tomorrow and also Robaxin 500 mg p.o. every 8 hours, for spasms.  Follow-up with me in 3 weeks for recheck.  I will have patient call me in 2 weeks to let me know how he is feeling.  If he continues to have ongoing symptoms I will plan to get an MRI of his lumbar spine.  Follow-Up Instructions: Return in about 3 weeks (around 05/04/2019) for With Jeneen Rinks for recheck lumbar spine.   Orders:  Orders Placed This Encounter  Procedures  . XR Lumbar Spine 2-3 Views   Meds ordered this encounter  Medications  . methylPREDNISolone acetate (DEPO-MEDROL) injection 80 mg  . ketorolac (TORADOL) 30 MG/ML injection 30 mg  . methylPREDNISolone (MEDROL) 4 MG tablet    Sig: 73 day taper to be taken as directed    Dispense:  21 tablet    Refill:  0  . methocarbamol (ROBAXIN) 500 MG tablet    Sig: Take 1 tablet (500 mg total) by mouth every 8 (eight) hours as needed for muscle spasms.    Dispense:  40 tablet    Refill:  0      Procedures: No procedures performed   Clinical Data: No additional findings.   Subjective: Chief Complaint  Patient presents with  . Lower Back - Pain    HPI 74 year old 41 male with known history of multilevel lumbar spondylosis comes in with complaints of  acute on chronic low back pain and now with left lower extremity radiculopathy.  Patient was last seen by Dr. 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  Numbness and tingling in these areas as well.  No problems with the right leg.  He has not had any weakness.  No complaints of bowel or bladder issues. Review of Systems No current cardiac pulmonary GI GU issues  Objective: Vital Signs: There were no vitals taken for this visit.  Physical Exam HENT:     Head: Normocephalic and atraumatic.  Eyes:     Extraocular Movements: Extraocular movements intact.     Pupils: Pupils are equal, round, and reactive to light.  Pulmonary:     Effort: No respiratory distress.  Musculoskeletal:     Comments: Patient is somewhat slow getting up from a seated position due to his pain.  Positive left lumbar paraspinal tenderness/spasm.  Positive left-sided notch tenderness.  Negative on the right side.  Negative logroll bilateral hips.  Positive left straight leg raise.  Bilateral calves  nontender.  Neurovascular intact.  No focal motor deficits.  Skin:    General: Skin is warm and dry.  Neurological:     General: No focal deficit present.     Mental Status: He is alert and oriented to person, place, and time.  Psychiatric:        Mood and Affect: Mood normal.     Ortho Exam  Specialty Comments:  No specialty comments available.  Imaging: No results found.   PMFS History: Patient Active Problem List   Diagnosis Date Noted  . H/O heart artery stent 09/17/2018  . Hamstring strain, left, initial encounter 02/19/2018  . Primary osteoarthritis of left knee 02/19/2018  . Contusion of right foot 08/20/2017  . Right foot pain 08/20/2017  . Radiculopathy of lumbar region 04/03/2017  . Pain in right buttock 03/05/2017   . Dyslipidemia 12/16/2016  . CAD in native artery 12/05/2014  . Hyperlipidemia 12/05/2014  . Tear of medial meniscus of left knee, subsequent encounter 05/18/2014  . Acute pain of left knee 04/18/2014  . Primary osteoarthritis of right knee 04/18/2014   Past Medical History:  Diagnosis Date  . Asthma   . Coronary artery disease   . Hyperlipidemia   . Hypertension     Family History  Problem Relation Age of Onset  . Heart disease Father   . Heart disease Brother   . Heart disease Brother     Past Surgical History:  Procedure Laterality Date  . BACK SURGERY    . KNEE SURGERY     Social History   Occupational History  . Not on file  Tobacco Use  . Smoking status: Former Research scientist (life sciences)  . Smokeless tobacco: Never Used  Substance and Sexual Activity  . Alcohol use: Never  . Drug use: Never  . Sexual activity: Not on file

## 2019-04-14 ENCOUNTER — Ambulatory Visit: Payer: Medicare HMO | Admitting: Surgery

## 2019-05-05 ENCOUNTER — Encounter: Payer: Self-pay | Admitting: Specialist

## 2019-05-05 ENCOUNTER — Ambulatory Visit (INDEPENDENT_AMBULATORY_CARE_PROVIDER_SITE_OTHER): Payer: Medicare HMO | Admitting: Specialist

## 2019-05-05 ENCOUNTER — Other Ambulatory Visit: Payer: Self-pay

## 2019-05-05 VITALS — BP 114/78 | HR 52 | Ht 71.0 in | Wt 209.0 lb

## 2019-05-05 DIAGNOSIS — R29898 Other symptoms and signs involving the musculoskeletal system: Secondary | ICD-10-CM | POA: Diagnosis not present

## 2019-05-05 DIAGNOSIS — Z01812 Encounter for preprocedural laboratory examination: Secondary | ICD-10-CM

## 2019-05-05 DIAGNOSIS — M5416 Radiculopathy, lumbar region: Secondary | ICD-10-CM | POA: Diagnosis not present

## 2019-05-05 DIAGNOSIS — M4815 Ankylosing hyperostosis [Forestier], thoracolumbar region: Secondary | ICD-10-CM

## 2019-05-05 DIAGNOSIS — M5137 Other intervertebral disc degeneration, lumbosacral region: Secondary | ICD-10-CM | POA: Diagnosis not present

## 2019-05-05 DIAGNOSIS — M4726 Other spondylosis with radiculopathy, lumbar region: Secondary | ICD-10-CM

## 2019-05-05 LAB — BASIC METABOLIC PANEL WITH GFR
BUN/Creatinine Ratio: 12 (calc) (ref 6–22)
BUN: 15 mg/dL (ref 7–25)
CO2: 28 mmol/L (ref 20–32)
Calcium: 10 mg/dL (ref 8.6–10.3)
Chloride: 101 mmol/L (ref 98–110)
Creat: 1.28 mg/dL — ABNORMAL HIGH (ref 0.70–1.18)
GFR, Est African American: 64 mL/min/{1.73_m2} (ref >=60)
GFR, Est Non African American: 56 mL/min/{1.73_m2} — ABNORMAL LOW (ref >=60)
Glucose, Bld: 86 mg/dL (ref 65–99)
Potassium: 4.9 mmol/L (ref 3.5–5.3)
Sodium: 140 mmol/L (ref 135–146)

## 2019-05-05 MED ORDER — GABAPENTIN 100 MG PO CAPS: 100.0000 mg | ORAL_CAPSULE | Freq: Every day | ORAL | 0 refills | Status: DC

## 2019-05-05 NOTE — Progress Notes (Signed)
Office Visit Note   Patient: Sean Marks           Date of Birth: August 09, 1945           MRN: [id number] Visit Date: 05/05/2019              Requested by: Cher Nakai, MD [address] [place],  [place] [address] PCP: Cher Nakai, MD   Assessment & Plan: Visit Diagnoses:  1. Radiculopathy, lumbar region   2. 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 Other spondylosis with radiculopathy, lumbar region   4. DDD (degenerative disc disease), lumbosacral   5. Weakness of left leg     Plan: Avoid frequent bending and stooping  No lifting greater than 10 lbs. May use ice or moist heat for pain. Weight loss is of benefit. Best medication for lumbar disc disease is arthritis medications like motrin, celebrex and naprosyn. Exercise is important to improve your indurance and does allow people to function better inspite of back pain. For pain alleve or tylenol Start gabapentin 100mg  po qhs. MRI with and without enhancement Follow-Up Instructions: No follow-ups on file.   Orders:  No orders of the defined types were placed in this encounter.  No orders of the defined types were placed in this encounter.     Procedures: No procedures performed   Clinical Data: No additional findings.   Subjective: Chief Complaint  Patient presents with  . 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 He reports doing well till this past Christmas and he started having pain in the left anterior thigh and radiated into the left anterior  Thigh and knee. 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 He has not fallen. No bowel or bladder difficulty. Sleeping fine.    Review of Systems  Constitutional: Negative.  Negative for activity change,  appetite change, chills, diaphoresis, fatigue, fever and unexpected weight change.  HENT: Negative.  Negative for congestion, dental problem, drooling, ear discharge, ear pain, facial swelling, hearing loss, mouth sores, nosebleeds, postnasal drip, rhinorrhea, sinus pressure, sinus pain, sneezing, sore throat and tinnitus.   Eyes: Negative for photophobia, pain, discharge, redness, itching and visual disturbance.  Respiratory: Negative.  Negative for apnea, cough, choking, chest tightness, shortness of breath, wheezing and stridor.   Cardiovascular: Negative.  Negative for chest pain, palpitations and leg swelling.  Gastrointestinal: Negative.  Negative for abdominal distention, abdominal pain, anal bleeding, blood in stool, constipation, diarrhea, nausea, rectal pain and vomiting.  Endocrine: Negative for cold intolerance, heat intolerance, polydipsia, polyphagia and polyuria.  Genitourinary: Negative.  Negative for difficulty urinating, dysuria, enuresis, frequency and urgency.  Musculoskeletal: Positive for back pain. Negative for arthralgias, gait problem, joint swelling, myalgias, neck pain and neck stiffness.  Skin: Negative.  Negative for color change, pallor, rash and wound.  Allergic/Immunologic: Negative for environmental allergies, food allergies and immunocompromised state.  Neurological: Positive for weakness and numbness. Negative for dizziness, tremors, seizures, syncope, facial asymmetry, speech difficulty, light-headedness and headaches.  Psychiatric/Behavioral: Negative for agitation, behavioral problems, confusion, decreased concentration, dysphoric mood, hallucinations, self-injury, sleep disturbance and suicidal ideas. The patient is not nervous/anxious and is not hyperactive.      Objective: Vital Signs: BP 114/78 (BP Location:  Left Arm, Patient Position: Sitting)   Pulse (!) 52   Ht 5\' 11"  (1.803 m)   Wt 209 lb (94.8 kg)   BMI 29.15 kg/m   Physical Exam Constitutional:       Appearance: He is well-developed.  HENT:     Head: Normocephalic and atraumatic.  Eyes:     Pupils: Pupils are equal, round, and reactive to light.  Pulmonary:     Effort: Pulmonary effort is normal.     Breath sounds: Normal breath sounds.  Abdominal:     General: Bowel sounds are normal.     Palpations: Abdomen is soft.  Musculoskeletal:     Cervical back: Normal range of motion and neck supple.     Lumbar back: Negative right straight leg raise test and negative left straight leg raise test.  Skin:    General: Skin is warm and dry.  Neurological:     Mental Status: He is alert and oriented to person, place, and time.  Psychiatric:        Behavior: Behavior normal.        Thought Content: Thought content normal.        Judgment: Judgment normal.     Back Exam   Tenderness  The patient is experiencing tenderness in the lumbar.  Range of Motion  Extension: abnormal  Flexion: abnormal  Lateral bend right: abnormal  Lateral bend left: abnormal  Rotation right: abnormal  Rotation left: abnormal   Muscle Strength  Right Quadriceps:  5/5  Left Quadriceps:  4/5  Right Hamstrings:  5/5  Left Hamstrings:  5/5   Tests  Straight leg raise right: negative Straight leg raise left: negative  Reflexes  Patellar: 0/4 Achilles: 2/4 Biceps: 0/4 Babinski's sign: normal   Other  Toe walk: normal Heel walk: normal Sensation: normal Gait: normal  Erythema: no back redness Scars: absent  Comments:  Left hip flexion is 4/5, left knee extension is 5-/5, left foot DF is 5-/5      Specialty Comments:  No specialty comments available.  Imaging: No results found.   PMFS History: Patient Active Problem List   Diagnosis Date Noted  . H/O heart artery stent 09/17/2018  . Hamstring strain, left, initial encounter 02/19/2018  . Primary osteoarthritis of left knee 02/19/2018  . Contusion of right foot 08/20/2017  . Right foot pain 08/20/2017  . Radiculopathy of  lumbar region 04/03/2017  . Pain in right buttock 03/05/2017  . Dyslipidemia 12/16/2016  . CAD in native artery 12/05/2014  . Hyperlipidemia 12/05/2014  . Tear of medial meniscus of left knee, subsequent encounter 05/18/2014  . Acute pain of left knee 04/18/2014  . Primary osteoarthritis of right knee 04/18/2014   Past Medical History:  Diagnosis Date  . Asthma   . Coronary artery disease   . Hyperlipidemia   . Hypertension     Family History  Problem Relation Age of Onset  . Heart disease Father   . Heart disease Brother   . Heart disease Brother     Past Surgical History:  Procedure Laterality Date  . BACK SURGERY    . KNEE SURGERY     Social History   Occupational History  . Not on file  Tobacco Use  . Smoking status: Former Research scientist (life sciences)  . Smokeless tobacco: Never Used  Substance and Sexual Activity  . Alcohol use: Never  . Drug use: Never  . Sexual activity: Not on file

## 2019-05-05 NOTE — Patient Instructions (Signed)
Plan: Avoid frequent bending and stooping  No lifting greater than 10 lbs. May use ice or moist heat for pain. Weight loss is of benefit. Best medication for lumbar disc disease is arthritis medications like motrin, celebrex and naprosyn. Exercise is important to improve your indurance and does allow people to function better inspite of back pain. For pain alleve or tylenol Start gabapentin 100mg  po qhs. MRI with and without enhancement

## 2019-05-10 ENCOUNTER — Encounter: Payer: Self-pay | Admitting: Specialist

## 2019-05-12 DIAGNOSIS — M5416 Radiculopathy, lumbar region: Secondary | ICD-10-CM | POA: Diagnosis not present

## 2019-05-12 DIAGNOSIS — R29898 Other symptoms and signs involving the musculoskeletal system: Secondary | ICD-10-CM | POA: Diagnosis not present

## 2019-05-12 DIAGNOSIS — M5116 Intervertebral disc disorders with radiculopathy, lumbar region: Secondary | ICD-10-CM | POA: Diagnosis not present

## 2019-05-12 DIAGNOSIS — M4815 Ankylosing hyperostosis [Forestier], thoracolumbar region: Secondary | ICD-10-CM | POA: Diagnosis not present

## 2019-05-12 DIAGNOSIS — M48061 Spinal stenosis, lumbar region without neurogenic claudication: Secondary | ICD-10-CM | POA: Diagnosis not present

## 2019-05-12 DIAGNOSIS — M4726 Other spondylosis with radiculopathy, lumbar region: Secondary | ICD-10-CM | POA: Diagnosis not present

## 2019-05-18 DIAGNOSIS — Z800 Family history of malignant neoplasm of digestive organs: Secondary | ICD-10-CM | POA: Diagnosis not present

## 2019-05-18 DIAGNOSIS — Z1211 Encounter for screening for malignant neoplasm of colon: Secondary | ICD-10-CM | POA: Diagnosis not present

## 2019-05-18 DIAGNOSIS — D120 Benign neoplasm of cecum: Secondary | ICD-10-CM | POA: Diagnosis not present

## 2019-05-18 DIAGNOSIS — K635 Polyp of colon: Secondary | ICD-10-CM | POA: Diagnosis not present

## 2019-05-18 DIAGNOSIS — Z86010 Personal history of colonic polyps: Secondary | ICD-10-CM | POA: Diagnosis not present

## 2019-05-26 ENCOUNTER — Other Ambulatory Visit: Payer: Self-pay

## 2019-05-26 ENCOUNTER — Ambulatory Visit: Payer: Medicare HMO | Admitting: Specialist

## 2019-05-26 ENCOUNTER — Encounter: Payer: Self-pay | Admitting: Specialist

## 2019-05-26 VITALS — BP 125/69 | HR 91 | Ht 71.0 in | Wt 210.0 lb

## 2019-05-26 DIAGNOSIS — R29898 Other symptoms and signs involving the musculoskeletal system: Secondary | ICD-10-CM

## 2019-05-26 DIAGNOSIS — M5137 Other intervertebral disc degeneration, lumbosacral region: Secondary | ICD-10-CM

## 2019-05-26 DIAGNOSIS — M4815 Ankylosing hyperostosis [Forestier], thoracolumbar region: Secondary | ICD-10-CM | POA: Diagnosis not present

## 2019-05-26 DIAGNOSIS — M5116 Intervertebral disc disorders with radiculopathy, lumbar region: Secondary | ICD-10-CM

## 2019-05-26 DIAGNOSIS — M5416 Radiculopathy, lumbar region: Secondary | ICD-10-CM | POA: Diagnosis not present

## 2019-05-26 MED ORDER — TRAMADOL HCL 50 MG PO TABS: 50.0000 mg | ORAL_TABLET | Freq: Four times a day (QID) | ORAL | 0 refills | Status: DC | PRN

## 2019-05-26 NOTE — Progress Notes (Signed)
Office Visit Note   Patient: Sean Marks           Date of Birth: [birth year]           MRN: [id number] Visit Date: 05/26/2019              Requested by: Cher Nakai, MD [address] [place],  [place] [address] PCP: Cher Nakai, MD   Assessment & Plan: Visit Diagnoses:  1. Weakness of left leg   2. DDD (degenerative disc disease), lumbosacral   3. Radiculopathy, lumbar region   4. Ankylosing hyperostosis (forestier), thoracolumbar region   5. Lumbar disc herniation with radiculopathy     Avoid bending, stooping and avoid lifting weights greater than 10 lbs. Avoid prolong standing and walking. Avoid frequent bending and stooping  No lifting greater than 10 lbs. May use ice or moist heat for pain. Weight loss is of benefit. Handicap license is approved. Call is you want to consider an epidural steroid injection, Dr. Romona Curls secretary/Assistant will call to arrange for epidural steroid injection   Follow-Up Instructions: No follow-ups on file.   Orders:  No orders of the defined types were placed in this encounter.  No orders of the defined types were placed in this encounter.     Procedures: No procedures performed   Clinical Data: No additional findings.   Subjective: Chief Complaint  Patient presents with  . Lower Back - Follow-up    74 year old male with history of left anterior thigh numbness and pain, weakness with standing and walking. He has difficulty with standing and walking tolerance is diminished. He has a history of cardiac stent 10-12 years ago. Last saw his cardiologist about couple months ago And he thought he was stable. Pain is in the left anterior thigh. 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   Review of Systems  Constitutional: Negative.   HENT: Negative.   Eyes: Negative.   Respiratory: Negative.   Cardiovascular: Negative.   Gastrointestinal: Negative.   Endocrine: Negative.   Genitourinary: Negative.     Musculoskeletal: Negative.   Skin: Negative.   Allergic/Immunologic: Negative.   Neurological: Negative.   Hematological: Negative.   Psychiatric/Behavioral: Negative.      Objective: Vital Signs: BP 125/69 (BP Location: Left Arm, Patient Position: Sitting, Cuff Size: Normal)   Pulse 91   Ht 5\' 11"  (1.803 m)   Wt 210 lb (95.3 kg)   BMI 29.29 kg/m   Physical Exam Constitutional:      Appearance: He is well-developed.  HENT:     Head: Normocephalic and atraumatic.  Eyes:     Pupils: Pupils are equal, round, and reactive to light.  Pulmonary:     Effort: Pulmonary effort is normal.     Breath sounds: Normal breath sounds.  Abdominal:     General: Bowel sounds are normal.     Palpations: Abdomen is soft.  Musculoskeletal:     Cervical back: Normal range of motion and neck supple.     Lumbar back: Negative right straight leg raise test and negative left straight leg raise test.  Skin:    General: Skin is warm and dry.  Neurological:     Mental Status: He is alert and oriented to person, place, and time.  Psychiatric:        Behavior: Behavior normal.        Thought Content: Thought content normal.        Judgment: Judgment normal.  Back Exam   Tenderness  The patient is experiencing tenderness in the lumbar.  Range of Motion  Extension: abnormal  Flexion: normal  Lateral bend right: abnormal  Lateral bend left: abnormal  Rotation right: abnormal  Rotation left: abnormal   Muscle Strength  Right Quadriceps:  4/5  Left Quadriceps:  5/5  Right Hamstrings:  5/5  Left Hamstrings:  5/5   Tests  Straight leg raise right: negative Straight leg raise left: negative  Reflexes  Patellar: 0/4 Achilles: 2/4 Babinski's sign: normal   Other  Toe walk: normal Heel walk: normal Sensation: decreased Gait: abnormal   Comments:  Left quad is weak 4/5 and left hip flexion is 4/5      Specialty Comments:  No specialty comments available.  Imaging: No  results found.   PMFS History: Patient Active Problem List   Diagnosis Date Noted  . H/O heart artery stent 09/17/2018  . Hamstring strain, left, initial encounter 02/19/2018  . Primary osteoarthritis of left knee 02/19/2018  . Contusion of right foot 08/20/2017  . Right foot pain 08/20/2017  . Radiculopathy of lumbar region 04/03/2017  . Pain in right buttock 03/05/2017  . Dyslipidemia 12/16/2016  . CAD in native artery 12/05/2014  . Hyperlipidemia 12/05/2014  . Tear of medial meniscus of left knee, subsequent encounter 05/18/2014  . Acute pain of left knee 04/18/2014  . Primary osteoarthritis of right knee 04/18/2014   Past Medical History:  Diagnosis Date  . Asthma   . Coronary artery disease   . Hyperlipidemia   . Hypertension     Family History  Problem Relation Age of Onset  . Heart disease Father   . Heart disease Brother   . Heart disease Brother     Past Surgical History:  Procedure Laterality Date  . BACK SURGERY    . KNEE SURGERY     Social History   Occupational History  . Not on file  Tobacco Use  . Smoking status: Former Research scientist (life sciences)  . Smokeless tobacco: Never Used  Substance and Sexual Activity  . Alcohol use: Never  . Drug use: Never  . Sexual activity: Not on file

## 2019-05-26 NOTE — Patient Instructions (Signed)
Avoid bending, stooping and avoid lifting weights greater than 10 lbs. Avoid prolong standing and walking. Avoid frequent bending and stooping  No lifting greater than 10 lbs. May use ice or moist heat for pain. Weight loss is of benefit. Handicap license is approved. Call is you want to consider an epidural steroid injection, Dr. Romona Curls secretary/Assistant will call to arrange for epidural steroid injection

## 2019-06-17 DIAGNOSIS — Z6830 Body mass index (BMI) 30.0-30.9, adult: Secondary | ICD-10-CM | POA: Diagnosis not present

## 2019-06-17 DIAGNOSIS — I252 Old myocardial infarction: Secondary | ICD-10-CM | POA: Diagnosis not present

## 2019-06-17 DIAGNOSIS — I10 Essential (primary) hypertension: Secondary | ICD-10-CM | POA: Diagnosis not present

## 2019-06-17 DIAGNOSIS — I2510 Atherosclerotic heart disease of native coronary artery without angina pectoris: Secondary | ICD-10-CM | POA: Diagnosis not present

## 2019-06-17 DIAGNOSIS — E291 Testicular hypofunction: Secondary | ICD-10-CM | POA: Diagnosis not present

## 2019-06-17 DIAGNOSIS — E785 Hyperlipidemia, unspecified: Secondary | ICD-10-CM | POA: Diagnosis not present

## 2019-06-17 DIAGNOSIS — K219 Gastro-esophageal reflux disease without esophagitis: Secondary | ICD-10-CM | POA: Diagnosis not present

## 2019-06-17 DIAGNOSIS — E663 Overweight: Secondary | ICD-10-CM | POA: Diagnosis not present

## 2019-06-17 DIAGNOSIS — M159 Polyosteoarthritis, unspecified: Secondary | ICD-10-CM | POA: Diagnosis not present

## 2019-07-05 DIAGNOSIS — E663 Overweight: Secondary | ICD-10-CM | POA: Diagnosis not present

## 2019-07-05 DIAGNOSIS — E291 Testicular hypofunction: Secondary | ICD-10-CM | POA: Diagnosis not present

## 2019-07-05 DIAGNOSIS — Z6830 Body mass index (BMI) 30.0-30.9, adult: Secondary | ICD-10-CM | POA: Diagnosis not present

## 2019-07-05 DIAGNOSIS — E785 Hyperlipidemia, unspecified: Secondary | ICD-10-CM | POA: Diagnosis not present

## 2019-07-05 DIAGNOSIS — K219 Gastro-esophageal reflux disease without esophagitis: Secondary | ICD-10-CM | POA: Diagnosis not present

## 2019-07-05 DIAGNOSIS — M159 Polyosteoarthritis, unspecified: Secondary | ICD-10-CM | POA: Diagnosis not present

## 2019-07-05 DIAGNOSIS — I10 Essential (primary) hypertension: Secondary | ICD-10-CM | POA: Diagnosis not present

## 2019-07-05 DIAGNOSIS — I252 Old myocardial infarction: Secondary | ICD-10-CM | POA: Diagnosis not present

## 2019-07-05 DIAGNOSIS — N1831 Chronic kidney disease, stage 3a: Secondary | ICD-10-CM | POA: Diagnosis not present

## 2019-07-05 DIAGNOSIS — I2510 Atherosclerotic heart disease of native coronary artery without angina pectoris: Secondary | ICD-10-CM | POA: Diagnosis not present

## 2019-07-11 IMAGING — CR DG FEMUR 2+V*L*
4 series · 4 of 4 positions shown · non-contrast
Comparison: None.

CLINICAL DATA: Posterior hip pain

EXAM:
LEFT FEMUR 2 VIEWS

[femur ap (1 of 2)]
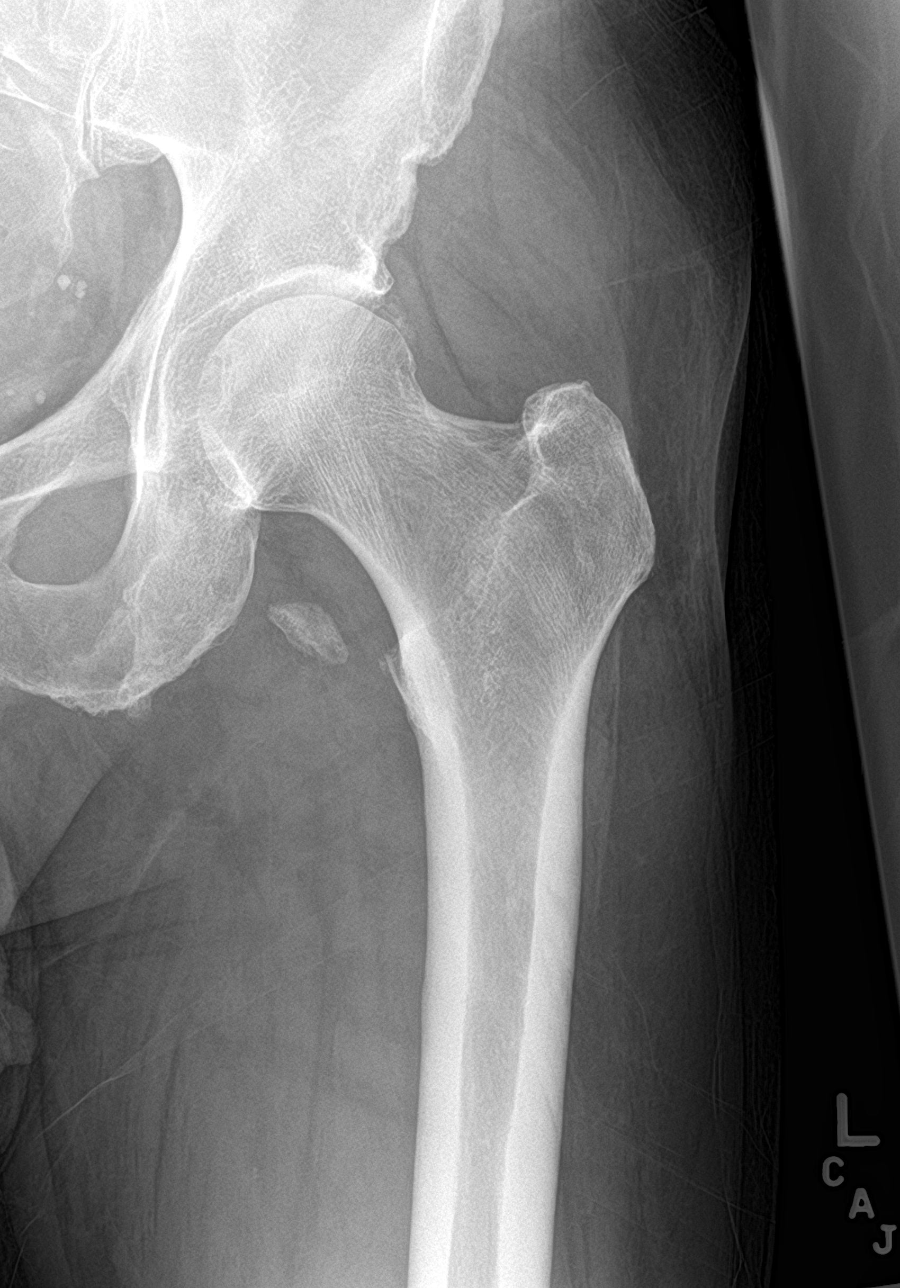

[femur ap (2 of 2)]
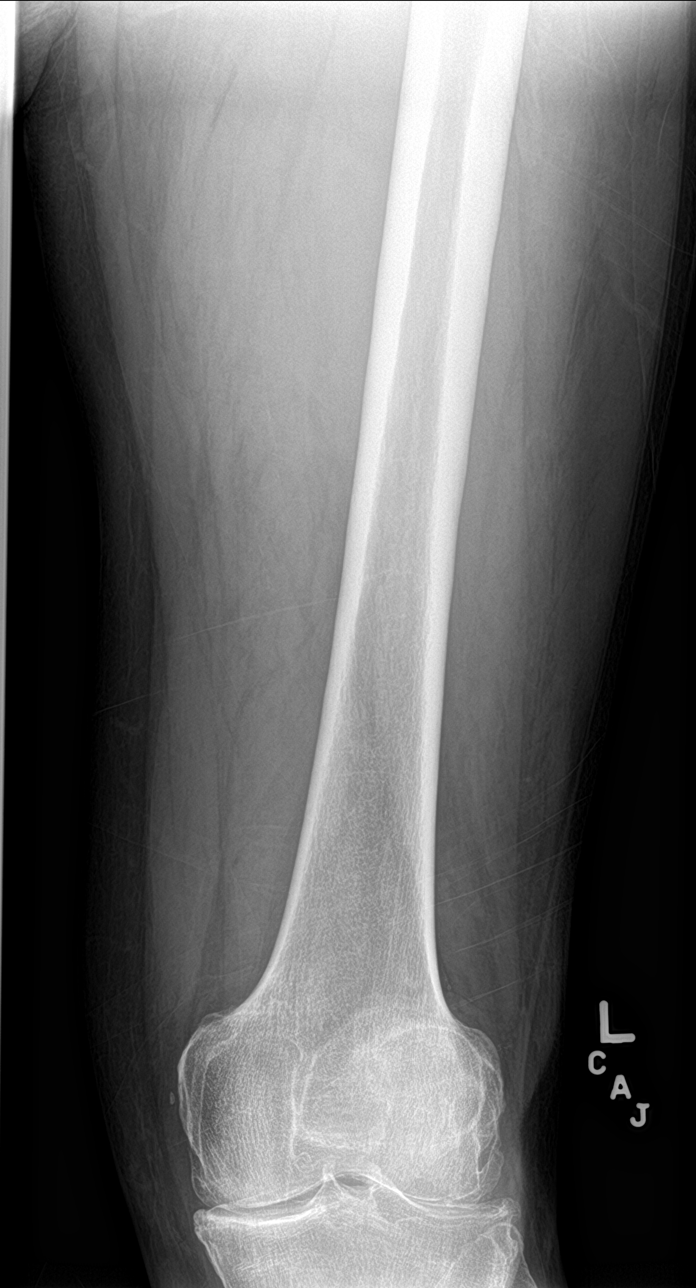

[femur lat (1 of 2)]
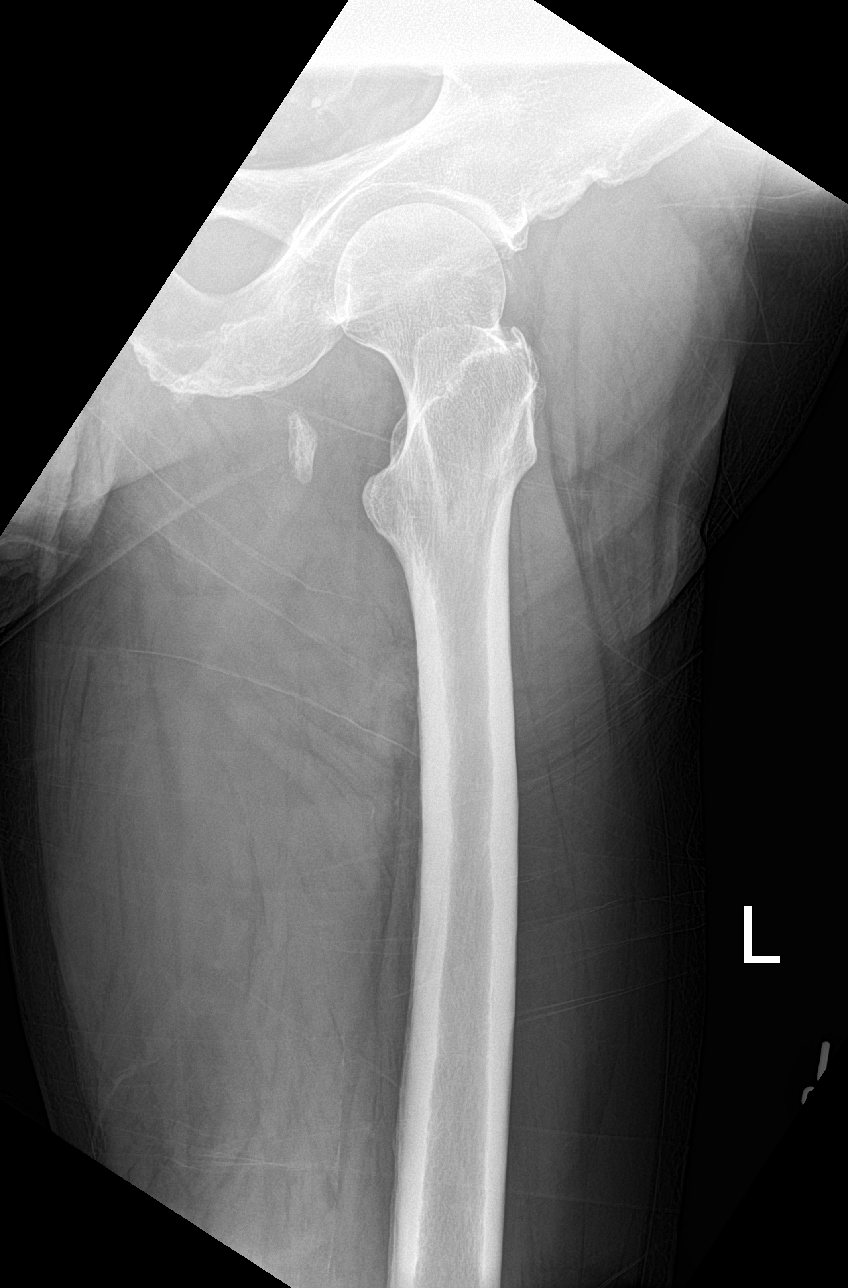

[femur lat (2 of 2)]
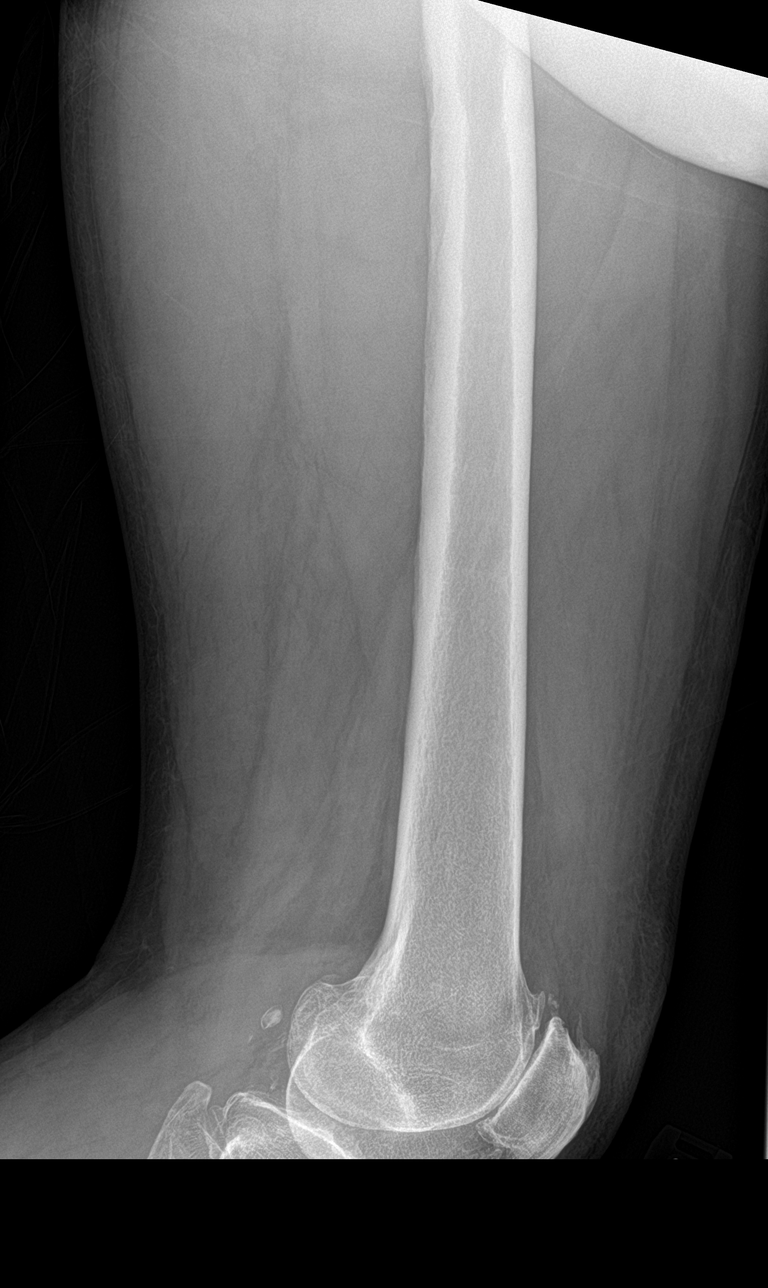

[4 of 4 positions shown; findings below may reference images not displayed]

FINDINGS: No acute displaced fracture or malalignment. Arthritis at the knee.
Dystrophic calcification within the soft tissues inferior to the
left proximal femur
IMPRESSION: No acute osseous abnormality

## 2019-07-19 DIAGNOSIS — I252 Old myocardial infarction: Secondary | ICD-10-CM | POA: Diagnosis not present

## 2019-07-19 DIAGNOSIS — M25511 Pain in right shoulder: Secondary | ICD-10-CM | POA: Diagnosis not present

## 2019-07-19 DIAGNOSIS — E663 Overweight: Secondary | ICD-10-CM | POA: Diagnosis not present

## 2019-07-19 DIAGNOSIS — N1831 Chronic kidney disease, stage 3a: Secondary | ICD-10-CM | POA: Diagnosis not present

## 2019-07-19 DIAGNOSIS — I10 Essential (primary) hypertension: Secondary | ICD-10-CM | POA: Diagnosis not present

## 2019-07-19 DIAGNOSIS — E291 Testicular hypofunction: Secondary | ICD-10-CM | POA: Diagnosis not present

## 2019-07-19 DIAGNOSIS — I2510 Atherosclerotic heart disease of native coronary artery without angina pectoris: Secondary | ICD-10-CM | POA: Diagnosis not present

## 2019-07-19 DIAGNOSIS — M159 Polyosteoarthritis, unspecified: Secondary | ICD-10-CM | POA: Diagnosis not present

## 2019-07-19 DIAGNOSIS — K219 Gastro-esophageal reflux disease without esophagitis: Secondary | ICD-10-CM | POA: Diagnosis not present

## 2019-07-19 DIAGNOSIS — E785 Hyperlipidemia, unspecified: Secondary | ICD-10-CM | POA: Diagnosis not present

## 2019-08-10 DIAGNOSIS — E785 Hyperlipidemia, unspecified: Secondary | ICD-10-CM | POA: Diagnosis not present

## 2019-08-10 DIAGNOSIS — I2510 Atherosclerotic heart disease of native coronary artery without angina pectoris: Secondary | ICD-10-CM | POA: Diagnosis not present

## 2019-08-10 DIAGNOSIS — N1831 Chronic kidney disease, stage 3a: Secondary | ICD-10-CM | POA: Diagnosis not present

## 2019-08-10 DIAGNOSIS — M159 Polyosteoarthritis, unspecified: Secondary | ICD-10-CM | POA: Diagnosis not present

## 2019-08-10 DIAGNOSIS — E663 Overweight: Secondary | ICD-10-CM | POA: Diagnosis not present

## 2019-08-10 DIAGNOSIS — Z6830 Body mass index (BMI) 30.0-30.9, adult: Secondary | ICD-10-CM | POA: Diagnosis not present

## 2019-08-10 DIAGNOSIS — I10 Essential (primary) hypertension: Secondary | ICD-10-CM | POA: Diagnosis not present

## 2019-08-10 DIAGNOSIS — K219 Gastro-esophageal reflux disease without esophagitis: Secondary | ICD-10-CM | POA: Diagnosis not present

## 2019-08-10 DIAGNOSIS — E291 Testicular hypofunction: Secondary | ICD-10-CM | POA: Diagnosis not present

## 2019-08-10 DIAGNOSIS — I252 Old myocardial infarction: Secondary | ICD-10-CM | POA: Diagnosis not present

## 2019-08-12 DIAGNOSIS — Z9181 History of falling: Secondary | ICD-10-CM | POA: Diagnosis not present

## 2019-08-12 DIAGNOSIS — E785 Hyperlipidemia, unspecified: Secondary | ICD-10-CM | POA: Diagnosis not present

## 2019-08-12 DIAGNOSIS — Z0000 Encounter for general adult medical examination without abnormal findings: Secondary | ICD-10-CM | POA: Diagnosis not present

## 2019-08-12 DIAGNOSIS — Z1331 Encounter for screening for depression: Secondary | ICD-10-CM | POA: Diagnosis not present

## 2019-08-12 DIAGNOSIS — Z6830 Body mass index (BMI) 30.0-30.9, adult: Secondary | ICD-10-CM | POA: Diagnosis not present

## 2019-09-05 DIAGNOSIS — E291 Testicular hypofunction: Secondary | ICD-10-CM | POA: Diagnosis not present

## 2019-09-05 DIAGNOSIS — K219 Gastro-esophageal reflux disease without esophagitis: Secondary | ICD-10-CM | POA: Diagnosis not present

## 2019-09-05 DIAGNOSIS — I2510 Atherosclerotic heart disease of native coronary artery without angina pectoris: Secondary | ICD-10-CM | POA: Diagnosis not present

## 2019-09-05 DIAGNOSIS — M159 Polyosteoarthritis, unspecified: Secondary | ICD-10-CM | POA: Diagnosis not present

## 2019-09-05 DIAGNOSIS — E785 Hyperlipidemia, unspecified: Secondary | ICD-10-CM | POA: Diagnosis not present

## 2019-09-05 DIAGNOSIS — N1831 Chronic kidney disease, stage 3a: Secondary | ICD-10-CM | POA: Diagnosis not present

## 2019-09-05 DIAGNOSIS — Z6830 Body mass index (BMI) 30.0-30.9, adult: Secondary | ICD-10-CM | POA: Diagnosis not present

## 2019-09-05 DIAGNOSIS — I252 Old myocardial infarction: Secondary | ICD-10-CM | POA: Diagnosis not present

## 2019-09-05 DIAGNOSIS — E663 Overweight: Secondary | ICD-10-CM | POA: Diagnosis not present

## 2019-09-05 DIAGNOSIS — I10 Essential (primary) hypertension: Secondary | ICD-10-CM | POA: Diagnosis not present

## 2019-09-19 DIAGNOSIS — I2510 Atherosclerotic heart disease of native coronary artery without angina pectoris: Secondary | ICD-10-CM | POA: Diagnosis not present

## 2019-09-19 DIAGNOSIS — K219 Gastro-esophageal reflux disease without esophagitis: Secondary | ICD-10-CM | POA: Diagnosis not present

## 2019-09-19 DIAGNOSIS — E291 Testicular hypofunction: Secondary | ICD-10-CM | POA: Diagnosis not present

## 2019-09-19 DIAGNOSIS — E785 Hyperlipidemia, unspecified: Secondary | ICD-10-CM | POA: Diagnosis not present

## 2019-09-19 DIAGNOSIS — I10 Essential (primary) hypertension: Secondary | ICD-10-CM | POA: Diagnosis not present

## 2019-09-19 DIAGNOSIS — M159 Polyosteoarthritis, unspecified: Secondary | ICD-10-CM | POA: Diagnosis not present

## 2019-09-19 DIAGNOSIS — Z6830 Body mass index (BMI) 30.0-30.9, adult: Secondary | ICD-10-CM | POA: Diagnosis not present

## 2019-09-19 DIAGNOSIS — N1831 Chronic kidney disease, stage 3a: Secondary | ICD-10-CM | POA: Diagnosis not present

## 2019-09-19 DIAGNOSIS — E663 Overweight: Secondary | ICD-10-CM | POA: Diagnosis not present

## 2019-09-19 DIAGNOSIS — I252 Old myocardial infarction: Secondary | ICD-10-CM | POA: Diagnosis not present

## 2019-11-04 DIAGNOSIS — R69 Illness, unspecified: Secondary | ICD-10-CM | POA: Diagnosis not present

## 2019-12-08 DIAGNOSIS — I10 Essential (primary) hypertension: Secondary | ICD-10-CM | POA: Diagnosis not present

## 2019-12-08 DIAGNOSIS — N1831 Chronic kidney disease, stage 3a: Secondary | ICD-10-CM | POA: Diagnosis not present

## 2019-12-08 DIAGNOSIS — M159 Polyosteoarthritis, unspecified: Secondary | ICD-10-CM | POA: Diagnosis not present

## 2019-12-08 DIAGNOSIS — K219 Gastro-esophageal reflux disease without esophagitis: Secondary | ICD-10-CM | POA: Diagnosis not present

## 2019-12-08 DIAGNOSIS — I2510 Atherosclerotic heart disease of native coronary artery without angina pectoris: Secondary | ICD-10-CM | POA: Diagnosis not present

## 2019-12-08 DIAGNOSIS — I252 Old myocardial infarction: Secondary | ICD-10-CM | POA: Diagnosis not present

## 2019-12-08 DIAGNOSIS — E291 Testicular hypofunction: Secondary | ICD-10-CM | POA: Diagnosis not present

## 2019-12-08 DIAGNOSIS — E785 Hyperlipidemia, unspecified: Secondary | ICD-10-CM | POA: Diagnosis not present

## 2019-12-08 DIAGNOSIS — Z23 Encounter for immunization: Secondary | ICD-10-CM | POA: Diagnosis not present

## 2019-12-08 DIAGNOSIS — Z139 Encounter for screening, unspecified: Secondary | ICD-10-CM | POA: Diagnosis not present

## 2020-01-12 DIAGNOSIS — M47816 Spondylosis without myelopathy or radiculopathy, lumbar region: Secondary | ICD-10-CM | POA: Diagnosis not present

## 2020-01-12 DIAGNOSIS — G8929 Other chronic pain: Secondary | ICD-10-CM | POA: Diagnosis not present

## 2020-01-12 DIAGNOSIS — M25552 Pain in left hip: Secondary | ICD-10-CM | POA: Diagnosis not present

## 2020-01-12 DIAGNOSIS — M1712 Unilateral primary osteoarthritis, left knee: Secondary | ICD-10-CM | POA: Diagnosis not present

## 2020-01-12 DIAGNOSIS — M25462 Effusion, left knee: Secondary | ICD-10-CM | POA: Diagnosis not present

## 2020-01-12 DIAGNOSIS — M25562 Pain in left knee: Secondary | ICD-10-CM | POA: Diagnosis not present

## 2020-01-12 DIAGNOSIS — I708 Atherosclerosis of other arteries: Secondary | ICD-10-CM | POA: Diagnosis not present

## 2020-01-24 DIAGNOSIS — E663 Overweight: Secondary | ICD-10-CM | POA: Diagnosis not present

## 2020-01-24 DIAGNOSIS — I10 Essential (primary) hypertension: Secondary | ICD-10-CM | POA: Insufficient documentation

## 2020-01-24 DIAGNOSIS — Z01818 Encounter for other preprocedural examination: Secondary | ICD-10-CM | POA: Diagnosis not present

## 2020-01-24 DIAGNOSIS — M159 Polyosteoarthritis, unspecified: Secondary | ICD-10-CM | POA: Diagnosis not present

## 2020-01-24 DIAGNOSIS — K219 Gastro-esophageal reflux disease without esophagitis: Secondary | ICD-10-CM | POA: Diagnosis not present

## 2020-01-24 DIAGNOSIS — I2510 Atherosclerotic heart disease of native coronary artery without angina pectoris: Secondary | ICD-10-CM | POA: Diagnosis not present

## 2020-01-24 DIAGNOSIS — J45909 Unspecified asthma, uncomplicated: Secondary | ICD-10-CM | POA: Insufficient documentation

## 2020-01-24 DIAGNOSIS — E291 Testicular hypofunction: Secondary | ICD-10-CM | POA: Diagnosis not present

## 2020-01-24 DIAGNOSIS — I252 Old myocardial infarction: Secondary | ICD-10-CM | POA: Diagnosis not present

## 2020-01-25 ENCOUNTER — Other Ambulatory Visit: Payer: Self-pay

## 2020-01-25 ENCOUNTER — Ambulatory Visit: Payer: Medicare HMO | Admitting: Cardiology

## 2020-01-25 ENCOUNTER — Encounter: Payer: Self-pay | Admitting: Cardiology

## 2020-01-25 VITALS — BP 118/78 | HR 77 | Ht 71.0 in | Wt 212.4 lb

## 2020-01-25 DIAGNOSIS — I10 Essential (primary) hypertension: Secondary | ICD-10-CM | POA: Diagnosis not present

## 2020-01-25 DIAGNOSIS — I2510 Atherosclerotic heart disease of native coronary artery without angina pectoris: Secondary | ICD-10-CM | POA: Diagnosis not present

## 2020-01-25 DIAGNOSIS — E782 Mixed hyperlipidemia: Secondary | ICD-10-CM

## 2020-01-25 DIAGNOSIS — Z01818 Encounter for other preprocedural examination: Secondary | ICD-10-CM | POA: Diagnosis not present

## 2020-01-25 NOTE — Progress Notes (Signed)
Cardiology Office Note:    Date:  01/25/2020   ID:  Sean Marks, DOB 12-22-1945, MRN [id number]  PCP:  Cher Nakai, MD  Cardiologist:  No primary care provider on file.  Electrophysiologist:  None   Referring MD: Cher Nakai, MD   " I am here for pre-operative  History of Present Illness:    Sean Marks is a 74 y.o. male with a hx of coronary artery disease status post PCI, hypertension, hyperlipidemia is here today to be evaluated for preoperative clearance. The patient does see Dr. Bettina Gavia and was last seen by him in January 2021 at that time he appears to be doing well from a cardiovascular standpoint. He is here as he does have plans for left knee surgery.  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  He has been doing at least 60 to 70% of his work via Personal assistant.  He offers no other complaints at this time.   Past Medical History:  Diagnosis Date  . Asthma   . Coronary artery disease   . Hyperlipidemia   . Hypertension     Past Surgical History:  Procedure Laterality Date  . BACK SURGERY    . KNEE SURGERY      Current Medications: Current Meds  Medication Sig  . aspirin EC 81 MG tablet Take 1 tablet (81 mg total) by mouth every other day. (Patient taking differently: Take 81 mg by mouth daily. )  . Omega-3 Fatty Acids (FISH OIL) 1000 MG CAPS Take 1 capsule (1,000 mg total) by mouth 2 (two) times daily. (Patient taking differently: Take 1 capsule by mouth every other day. )  . omeprazole (PRILOSEC) 40 MG capsule Take 40 mg by mouth daily.  . ramipril (ALTACE) 5 MG capsule Take 1 capsule (5 mg total) by mouth every evening.  . simvastatin (ZOCOR) 40 MG tablet Take 1 tablet (40 mg total) by mouth at bedtime.  . [DISCONTINUED] gabapentin (NEURONTIN) 100 MG capsule Take 1 capsule (100 mg total) by mouth at bedtime.   Current Facility-Administered Medications for the 01/25/20 encounter (Office Visit) with Berniece Salines, DO  Medication  .  methylPREDNISolone acetate (DEPO-MEDROL) injection 80 mg     Allergies:   Oxycodone   Social History   Socioeconomic History  . Marital status: Married    Spouse name: Not on file  . Number of children: Not on file  . Years of education: Not on file  . Highest education level: Not on file  Occupational History  . Not on file  Tobacco Use  . Smoking status: Former Research scientist (life sciences)  . Smokeless tobacco: Never Used  Substance and Sexual Activity  . Alcohol use: Never  . Drug use: Never  . Sexual activity: Not on file  Other Topics Concern  . Not on file  Social History Narrative  . Not on file   Social Determinants of Health   Financial Resource Strain:   . Difficulty of Paying Living Expenses: Not on file  Food Insecurity:   . Worried About Charity fundraiser in the Last Year: Not on file  . Ran Out of Food in the Last Year: Not on file  Transportation Needs:   . Lack of Transportation (Medical): Not on file  . Lack of Transportation (Non-Medical): Not on file  Physical Activity:   . Days of Exercise per Week: Not on file  . Minutes of Exercise per Session: Not on file  Stress:   . Feeling of Stress : Not on file  Social Connections:   . Frequency of Communication with Friends and Family: Not on file  . Frequency of Social Gatherings with Friends and Family: Not on file  . Attends Religious Services: Not on file  . Active Member of Clubs or Organizations: Not on file  . Attends Archivist Meetings: Not on file  . Marital Status: Not on file     Family History: The patient's family history includes Heart disease in his brother, brother, and father.  ROS:   Review of Systems  Constitution: Negative for decreased appetite, fever and weight gain.  HENT: Negative for congestion, ear discharge, hoarse voice and sore throat.   Eyes: Negative for discharge, redness, vision loss in right eye and visual halos.  Cardiovascular: Negative for chest pain, dyspnea on  exertion, leg swelling, orthopnea and palpitations.  Respiratory: Negative for cough, hemoptysis, shortness of breath and snoring.   Endocrine: Negative for heat intolerance and polyphagia.  Hematologic/Lymphatic: Negative for bleeding problem. Does not bruise/bleed easily.  Skin: Negative for flushing, nail changes, rash and suspicious lesions.  Musculoskeletal: Negative for arthritis, joint pain, muscle cramps, myalgias, neck pain and stiffness.  Gastrointestinal: Negative for abdominal pain, bowel incontinence, diarrhea and excessive appetite.  Genitourinary: Negative for decreased libido, genital sores and incomplete emptying.  Neurological: Negative for brief paralysis, focal weakness, headaches and loss of balance.  Psychiatric/Behavioral: Negative for altered mental status, depression and suicidal ideas.  Allergic/Immunologic: Negative for HIV exposure and persistent infections.    EKGs/Labs/Other Studies Reviewed:    The following studies were reviewed today:   EKG:  The ekg ordered today demonstrates sinus rhythm, heart rate 70 bpm.  Recent Labs: 05/05/2019: BUN 15; Creat 1.28; Potassium 4.9; Sodium 140  Recent Lipid Panel No results found for: CHOL, TRIG, HDL, CHOLHDL, VLDL, LDLCALC, LDLDIRECT  Physical Exam:    VS:  BP 118/78   Pulse 77   Ht 5\' 11"  (1.803 m)   Wt 212 lb 6.4 oz (96.3 kg)   SpO2 96%   BMI 29.62 kg/m     Wt Readings from Last 3 Encounters:  01/25/20 212 lb 6.4 oz (96.3 kg)  05/26/19 210 lb (95.3 kg)  05/05/19 209 lb (94.8 kg)     GEN: Well nourished, well developed in no acute distress HEENT: Normal NECK: No JVD; No carotid bruits LYMPHATICS: No lymphadenopathy CARDIAC: S1S2 noted,RRR, no murmurs, rubs, gallops RESPIRATORY:  Clear to auscultation without rales, wheezing or rhonchi  ABDOMEN: Soft, non-tender, non-distended, +bowel sounds, no guarding. EXTREMITIES: No edema, No cyanosis, no clubbing MUSCULOSKELETAL:  No deformity  SKIN: Warm and  dry NEUROLOGIC:  Alert and oriented x 3, non-focal PSYCHIATRIC:  Normal affect, good insight  ASSESSMENT:    1. Pre-op evaluation   2. Hypertension, unspecified type   3. CAD in native artery   4. Mixed hyperlipidemia    PLAN:    His activities is limited by the fact that he has his left knee pain.  What I like to do is have the patient undergo preoperative clearance with a nuclear stress test.  We discussed this testing he is agreeable to proceed with a pharmacologic nuclear stress test.  His blood pressure acceptable in the office.  The patient is in agreement with the above plan. The patient left the office in stable condition.  The patient will follow up in 3 months with Dr. Bettina Gavia.   Medication Adjustments/Labs and Tests Ordered: Current medicines  are reviewed at length with the patient today.  Concerns regarding medicines are outlined above.  Orders Placed This Encounter  Procedures  . MYOCARDIAL PERFUSION IMAGING  . EKG 12-Lead   No orders of the defined types were placed in this encounter.   Patient Instructions  Medication Instructions:  Your physician recommends that you continue on your current medications as directed. Please refer to the Current Medication list given to you today.  *If you need a refill on your cardiac medications before your next appointment, please call your pharmacy*   Lab Work: None If you have labs (blood work) drawn today and your tests are completely normal, you will receive your results only by: Marland Kitchen MyChart Message (if you have MyChart) OR . A paper copy in the mail If you have any lab test that is abnormal or we need to change your treatment, we will call you to review the results.   Testing/Procedures:   [facility] [address] [place], [place] [address] Phone:  [phone]    Please arrive 15 minutes prior to your appointment time for registration and insurance purposes.  The test will take  approximately 3 to 4 hours to complete; you may bring reading material.  If someone comes with you to your appointment, they will need to remain in the main lobby due to limited space in the testing area. **If you are pregnant or breastfeeding, please notify the nuclear lab prior to your appointment**  How to prepare for your Myocardial Perfusion Test: . Do not eat or drink 3 hours prior to your test, except you may have water. . Do not consume products containing caffeine (regular or decaffeinated) 12 hours prior to your test. (ex: coffee, chocolate, sodas, tea). . Do bring a list of your current medications with you.  If not listed below, you may take your medications as normal. . Do wear comfortable clothes (no dresses or overalls) and walking shoes, tennis shoes preferred (No heels or open toe shoes are allowed). . Do NOT wear cologne, perfume, aftershave, or lotions (deodorant is allowed). . If these instructions are not followed, your test will have to be rescheduled.  Please report to [address] for your test.  If you have questions or concerns about your appointment, you can call the [facility] at [phone].  If you cannot keep your appointment, please provide 24 hours notification to the Nuclear Lab, to avoid a possible $50 charge to your account.    Follow-Up: At [facility], you and your health needs are our priority.  As part of our continuing mission to provide you with exceptional heart care, we have created designated Provider Care Teams.  These Care Teams include your primary Cardiologist (physician) and Advanced Practice Providers (APPs -  Physician Assistants and Nurse Practitioners) who all work together to provide you with the care you need, when you need it.  We recommend signing up for the patient portal called "MyChart".  Sign up information is provided on this After Visit Summary.  MyChart is used to connect with patients for  Virtual Visits (Telemedicine).  Patients are able to view lab/test results, encounter notes, upcoming appointments, etc.  Non-urgent messages can be sent to your provider as well.   To learn more about what you can do with MyChart, go to [email or web address].    Your next appointment:   3 month(s)  The format for your next appointment:   In Person  Provider:   Shirlee More, MD   Other Instructions      Adopting a Healthy Lifestyle.  Know what a healthy weight is for you (roughly BMI <25) and aim to maintain this   Aim for 7+ servings of fruits and vegetables daily   65-80+ fluid ounces of water or unsweet tea for healthy kidneys   Limit to max 1 drink of alcohol per day; avoid smoking/tobacco   Limit animal fats in diet for cholesterol and heart health - choose grass fed whenever available   Avoid highly processed foods, and foods high in saturated/trans fats   Aim for low stress - take time to unwind and care for your mental health   Aim for 150 min of moderate intensity exercise weekly for heart health, and weights twice weekly for bone health   Aim for 7-9 hours of sleep daily   When it comes to diets, agreement about the perfect plan isnt easy to find, even among the experts. Experts at the [employer] developed an idea known as the Healthy Eating Plate. Just imagine a plate divided into logical, healthy portions.   The emphasis is on diet quality:   Load up on vegetables and fruits - one-half of your plate: Aim for color and variety, and remember that potatoes dont count.   Go for whole grains - one-quarter of your plate: Whole wheat, barley, wheat berries, quinoa, oats, brown rice, and foods made with them. If you want pasta, go with whole wheat pasta.   Protein power - one-quarter of your plate: Fish, chicken, beans, and nuts are all healthy, versatile protein sources. Limit red meat.   The diet, however, does go beyond the plate,  offering a few other suggestions.   Use healthy plant oils, such as olive, canola, soy, corn, sunflower and peanut. Check the labels, and avoid partially hydrogenated oil, which have unhealthy trans fats.   If youre thirsty, drink water. Coffee and tea are good in moderation, but skip sugary drinks and limit milk and dairy products to one or two daily servings.   The type of carbohydrate in the diet is more important than the amount. Some sources of carbohydrates, such as vegetables, fruits, whole grains, and beans-are healthier than others.   Finally, stay active  Signed, Berniece Salines, DO  01/25/2020 4:15 PM    [facility]

## 2020-01-25 NOTE — Patient Instructions (Signed)
Medication Instructions:  Your physician recommends that you continue on your current medications as directed. Please refer to the Current Medication list given to you today.  *If you need a refill on your cardiac medications before your next appointment, please call your pharmacy*   Lab Work: None If you have labs (blood work) drawn today and your tests are completely normal, you will receive your results only by: MyChart Message (if you have MyChart) OR A paper copy in the mail If you have any lab test that is abnormal or we need to change your treatment, we will call you to review the results.   Testing/Procedures:   [facility] [address] [place], [place] [address] Phone:  [phone]    Please arrive 15 minutes prior to your appointment time for registration and insurance purposes.  The test will take approximately 3 to 4 hours to complete; you may bring reading material.  If someone comes with you to your appointment, they will need to remain in the main lobby due to limited space in the testing area. **If you are pregnant or breastfeeding, please notify the nuclear lab prior to your appointment**  How to prepare for your Myocardial Perfusion Test: Do not eat or drink 3 hours prior to your test, except you may have water. Do not consume products containing caffeine (regular or decaffeinated) 12 hours prior to your test. (ex: coffee, chocolate, sodas, tea). Do bring a list of your current medications with you.  If not listed below, you may take your medications as normal. Do wear comfortable clothes (no dresses or overalls) and walking shoes, tennis shoes preferred (No heels or open toe shoes are allowed). Do NOT wear cologne, perfume, aftershave, or lotions (deodorant is allowed). If these instructions are not followed, your test will have to be rescheduled.  Please report to [address] for your test.  If you have questions or  concerns about your appointment, you can call the [facility] at [phone].  If you cannot keep your appointment, please provide 24 hours notification to the Nuclear Lab, to avoid a possible $50 charge to your account.    Follow-Up: At [facility], you and your health needs are our priority.  As part of our continuing mission to provide you with exceptional heart care, we have created designated Provider Care Teams.  These Care Teams include your primary Cardiologist (physician) and Advanced Practice Providers (APPs -  Physician Assistants and Nurse Practitioners) who all work together to provide you with the care you need, when you need it.  We recommend signing up for the patient portal called "MyChart".  Sign up information is provided on this After Visit Summary.  MyChart is used to connect with patients for Virtual Visits (Telemedicine).  Patients are able to view lab/test results, encounter notes, upcoming appointments, etc.  Non-urgent messages can be sent to your provider as well.   To learn more about what you can do with MyChart, go to [email or web address].    Your next appointment:   3 month(s)  The format for your next appointment:   In Person  Provider:   Brian Munley, MD   Other Instructions   

## 2020-01-26 ENCOUNTER — Ambulatory Visit (INDEPENDENT_AMBULATORY_CARE_PROVIDER_SITE_OTHER): Payer: Medicare HMO

## 2020-01-26 ENCOUNTER — Telehealth: Payer: Self-pay

## 2020-01-26 VITALS — Ht 71.0 in | Wt 212.0 lb

## 2020-01-26 DIAGNOSIS — Z01818 Encounter for other preprocedural examination: Secondary | ICD-10-CM

## 2020-01-26 DIAGNOSIS — Z01810 Encounter for preprocedural cardiovascular examination: Secondary | ICD-10-CM

## 2020-01-26 DIAGNOSIS — I2510 Atherosclerotic heart disease of native coronary artery without angina pectoris: Secondary | ICD-10-CM

## 2020-01-26 LAB — MYOCARDIAL PERFUSION IMAGING
LV dias vol: 70 mL (ref 62–150)
LV sys vol: 31 mL
Peak HR: 85 {beats}/min
Rest HR: 53 {beats}/min
SDS: 2
SRS: 4
SSS: 6
TID: 1.01

## 2020-01-26 MED ORDER — TECHNETIUM TC 99M TETROFOSMIN IV KIT
32.0000 | PACK | Freq: Once | INTRAVENOUS | Status: AC | PRN
  Administered 2020-01-26: 32 via INTRAVENOUS

## 2020-01-26 MED ORDER — TECHNETIUM TC 99M TETROFOSMIN IV KIT
10.2000 | PACK | Freq: Once | INTRAVENOUS | Status: AC | PRN
  Administered 2020-01-26: 10.2 via INTRAVENOUS

## 2020-01-26 MED ORDER — REGADENOSON 0.4 MG/5ML IV SOLN
0.4000 mg | Freq: Once | INTRAVENOUS | Status: AC
  Administered 2020-01-26: 0.4 mg via INTRAVENOUS

## 2020-01-26 NOTE — Telephone Encounter (Signed)
Left message on patients voicemail to please return our call.   

## 2020-01-26 NOTE — Telephone Encounter (Signed)
Spoke with patient regarding results and recommendation.  Patient verbalizes understanding and is agreeable to plan of care. Advised patient to call back with any issues or concerns.  

## 2020-01-26 NOTE — Telephone Encounter (Signed)
Patient is returning call.  °

## 2020-02-02 ENCOUNTER — Telehealth: Payer: Self-pay

## 2020-02-02 NOTE — Telephone Encounter (Signed)
   [facility] Pre-operative Risk Assessment    HEARTCARE STAFF: - Please ensure there is not already an duplicate clearance open for this procedure. - Under Visit Info/Reason for Call, type in Other and utilize the format Clearance MM/DD/YY or Clearance TBD. Do not use dashes or single digits. - If request is for dental extraction, please clarify the # of teeth to be extracted.  Request for surgical clearance:  1. What type of surgery is being performed? Lt TKA   2. When is this surgery scheduled? TBD   3. What type of clearance is required (medical clearance vs. Pharmacy clearance to hold med vs. Both)? Both  4. Are there any medications that need to be held prior to surgery and how long? None specified    5. Practice name and name of physician performing surgery? [facility]    6. What is the office phone number? [phone]   7.   What is the office fax number? [phone]  8.   Anesthesia type (None, local, MAC, general) ? General   Gita Kudo 02/02/2020, 2:55 PM  _________________________________________________________________   (provider comments below)

## 2020-02-02 NOTE — Telephone Encounter (Signed)
   Primary Cardiologist: Dr. Bettina Gavia  Chart reviewed as part of pre-operative protocol coverage. Patient was seen by Dr. Harriet Masson on 01/25/2020 for pre-op evaluation for upcoming knee surgery. His activity was limited some by his knee pain. Therefore, nuclear stress test was ordered and was low risk with no evidence of ischemia. Given past medical history and time since last visit, based on ACC/AHA guidelines, Sean Marks would be at acceptable risk for the planned procedure without further cardiovascular testing.   I will route this recommendation to the requesting party via Epic fax function and remove from pre-op pool.  Please call with questions.  Darreld Mclean, PA-C 02/02/2020, 3:20 PM

## 2020-02-07 DIAGNOSIS — M159 Polyosteoarthritis, unspecified: Secondary | ICD-10-CM | POA: Diagnosis not present

## 2020-02-07 DIAGNOSIS — E663 Overweight: Secondary | ICD-10-CM | POA: Diagnosis not present

## 2020-02-07 DIAGNOSIS — I252 Old myocardial infarction: Secondary | ICD-10-CM | POA: Diagnosis not present

## 2020-02-07 DIAGNOSIS — E785 Hyperlipidemia, unspecified: Secondary | ICD-10-CM | POA: Diagnosis not present

## 2020-02-07 DIAGNOSIS — K219 Gastro-esophageal reflux disease without esophagitis: Secondary | ICD-10-CM | POA: Diagnosis not present

## 2020-02-07 DIAGNOSIS — I2510 Atherosclerotic heart disease of native coronary artery without angina pectoris: Secondary | ICD-10-CM | POA: Diagnosis not present

## 2020-02-07 DIAGNOSIS — N1831 Chronic kidney disease, stage 3a: Secondary | ICD-10-CM | POA: Diagnosis not present

## 2020-02-07 DIAGNOSIS — E291 Testicular hypofunction: Secondary | ICD-10-CM | POA: Diagnosis not present

## 2020-02-07 DIAGNOSIS — I10 Essential (primary) hypertension: Secondary | ICD-10-CM | POA: Diagnosis not present

## 2020-02-09 DIAGNOSIS — Z01818 Encounter for other preprocedural examination: Secondary | ICD-10-CM | POA: Diagnosis not present

## 2020-02-13 DIAGNOSIS — G8918 Other acute postprocedural pain: Secondary | ICD-10-CM | POA: Diagnosis not present

## 2020-02-13 DIAGNOSIS — M1712 Unilateral primary osteoarthritis, left knee: Secondary | ICD-10-CM | POA: Diagnosis not present

## 2020-02-14 DIAGNOSIS — Z96652 Presence of left artificial knee joint: Secondary | ICD-10-CM | POA: Diagnosis not present

## 2020-02-14 DIAGNOSIS — M1712 Unilateral primary osteoarthritis, left knee: Secondary | ICD-10-CM | POA: Diagnosis not present

## 2020-02-20 DIAGNOSIS — M6281 Muscle weakness (generalized): Secondary | ICD-10-CM | POA: Diagnosis not present

## 2020-02-20 DIAGNOSIS — M25562 Pain in left knee: Secondary | ICD-10-CM | POA: Diagnosis not present

## 2020-02-23 DIAGNOSIS — M25562 Pain in left knee: Secondary | ICD-10-CM | POA: Diagnosis not present

## 2020-02-23 DIAGNOSIS — Z96652 Presence of left artificial knee joint: Secondary | ICD-10-CM

## 2020-02-23 DIAGNOSIS — Z471 Aftercare following joint replacement surgery: Secondary | ICD-10-CM | POA: Diagnosis not present

## 2020-02-23 HISTORY — DX: Presence of left artificial knee joint: Z96.652

## 2020-02-25 DIAGNOSIS — Z96652 Presence of left artificial knee joint: Secondary | ICD-10-CM | POA: Diagnosis not present

## 2020-02-25 DIAGNOSIS — M1712 Unilateral primary osteoarthritis, left knee: Secondary | ICD-10-CM | POA: Diagnosis not present

## 2020-04-19 ENCOUNTER — Other Ambulatory Visit: Payer: Self-pay | Admitting: Cardiology

## 2020-04-19 DIAGNOSIS — I2510 Atherosclerotic heart disease of native coronary artery without angina pectoris: Secondary | ICD-10-CM

## 2020-04-19 DIAGNOSIS — M25562 Pain in left knee: Secondary | ICD-10-CM | POA: Diagnosis not present

## 2020-04-19 DIAGNOSIS — M6281 Muscle weakness (generalized): Secondary | ICD-10-CM | POA: Diagnosis not present

## 2020-04-24 DIAGNOSIS — M25562 Pain in left knee: Secondary | ICD-10-CM | POA: Diagnosis not present

## 2020-04-24 DIAGNOSIS — M6281 Muscle weakness (generalized): Secondary | ICD-10-CM | POA: Diagnosis not present

## 2020-05-02 ENCOUNTER — Ambulatory Visit: Payer: Medicare HMO | Admitting: Cardiology

## 2020-05-02 DIAGNOSIS — M6281 Muscle weakness (generalized): Secondary | ICD-10-CM | POA: Diagnosis not present

## 2020-05-02 DIAGNOSIS — M25562 Pain in left knee: Secondary | ICD-10-CM | POA: Diagnosis not present

## 2020-05-07 ENCOUNTER — Telehealth: Payer: Self-pay | Admitting: Cardiology

## 2020-05-07 DIAGNOSIS — I10 Essential (primary) hypertension: Secondary | ICD-10-CM | POA: Diagnosis not present

## 2020-05-07 DIAGNOSIS — I2510 Atherosclerotic heart disease of native coronary artery without angina pectoris: Secondary | ICD-10-CM | POA: Diagnosis not present

## 2020-05-07 DIAGNOSIS — E785 Hyperlipidemia, unspecified: Secondary | ICD-10-CM | POA: Diagnosis not present

## 2020-05-07 DIAGNOSIS — K219 Gastro-esophageal reflux disease without esophagitis: Secondary | ICD-10-CM | POA: Diagnosis not present

## 2020-05-07 DIAGNOSIS — E291 Testicular hypofunction: Secondary | ICD-10-CM | POA: Diagnosis not present

## 2020-05-07 DIAGNOSIS — E663 Overweight: Secondary | ICD-10-CM | POA: Diagnosis not present

## 2020-05-07 DIAGNOSIS — M159 Polyosteoarthritis, unspecified: Secondary | ICD-10-CM | POA: Diagnosis not present

## 2020-05-07 DIAGNOSIS — I252 Old myocardial infarction: Secondary | ICD-10-CM | POA: Diagnosis not present

## 2020-05-07 DIAGNOSIS — N1831 Chronic kidney disease, stage 3a: Secondary | ICD-10-CM | POA: Diagnosis not present

## 2020-05-07 NOTE — Telephone Encounter (Signed)
Sean Marks is calling wanting to know if Dr. Bettina Gavia is wanting labs at his upcoming appointment. Please advise.

## 2020-05-07 NOTE — Progress Notes (Deleted)
Cardiology Office Note:    Date:  05/07/2020   ID:  Sean Marks, DOB 08-04-1945, MRN [id number]  PCP:  Cher Nakai, MD  Cardiologist:  Shirlee More, MD    Referring MD: Cher Nakai, MD    ASSESSMENT:    No diagnosis found. PLAN:    In order of problems listed above:  1. ***   Next appointment: ***   Medication Adjustments/Labs and Tests Ordered: Current medicines are reviewed at length with the patient today.  Concerns regarding medicines are outlined above.  No orders of the defined types were placed in this encounter.  No orders of the defined types were placed in this encounter.   No chief complaint on file.   History of Present Illness:    Sean Marks is a 75 y.o. male with a hx of CAD hypertension and hyper lipidemia last seen 01/25/2020 and preoperative visit.  He had a myocardial perfusion study performed 01/26/2020 showing EF of 56% normal perfusion normal cardiac function low risk test. Compliance with diet, lifestyle and medications: *** Past Medical History:  Diagnosis Date  . Asthma   . Coronary artery disease   . Hyperlipidemia   . Hypertension     Past Surgical History:  Procedure Laterality Date  . BACK SURGERY    . KNEE SURGERY      Current Medications: No outpatient medications have been marked as taking for the 05/08/20 encounter (Appointment) with Richardo Priest, MD.   Current Facility-Administered Medications for the 05/08/20 encounter (Appointment) with Richardo Priest, MD  Medication  . methylPREDNISolone acetate (DEPO-MEDROL) injection 80 mg     Allergies:   Oxycodone   Social History   Socioeconomic History  . Marital status: Married    Spouse name: Not on file  . Number of children: Not on file  . Years of education: Not on file  . Highest education level: Not on file  Occupational History  . Not on file  Tobacco Use  . Smoking status: Former Research scientist (life sciences)  . Smokeless tobacco: Never Used  Substance and Sexual Activity  .  Alcohol use: Never  . Drug use: Never  . Sexual activity: Not on file  Other Topics Concern  . Not on file  Social History Narrative  . Not on file   Social Determinants of Health   Financial Resource Strain: Not on file  Food Insecurity: Not on file  Transportation Needs: Not on file  Physical Activity: Not on file  Stress: Not on file  Social Connections: Not on file     Family History: The patient's ***family history includes Heart disease in his brother, brother, and father. ROS:   Please see the history of present illness.    All other systems reviewed and are negative.  EKGs/Labs/Other Studies Reviewed:    The following studies were reviewed today:  EKG:  EKG ordered today and personally reviewed.  The ekg ordered today demonstrates ***  Recent Labs: No results found for requested labs within last 8760 hours.  Recent Lipid Panel No results found for: CHOL, TRIG, HDL, CHOLHDL, VLDL, LDLCALC, LDLDIRECT  Physical Exam:    VS:  There were no vitals taken for this visit.    Wt Readings from Last 3 Encounters:  01/26/20 212 lb (96.2 kg)  01/25/20 212 lb 6.4 oz (96.3 kg)  05/26/19 210 lb (95.3 kg)     GEN: *** Well nourished, well developed in no acute distress HEENT: Normal NECK: No JVD; No carotid bruits  LYMPHATICS: No lymphadenopathy CARDIAC: ***RRR, no murmurs, rubs, gallops RESPIRATORY:  Clear to auscultation without rales, wheezing or rhonchi  ABDOMEN: Soft, non-tender, non-distended MUSCULOSKELETAL:  No edema; No deformity  SKIN: Warm and dry NEUROLOGIC:  Alert and oriented x 3 PSYCHIATRIC:  Normal affect    Signed, Shirlee More, MD  05/07/2020 10:50 AM    [place]

## 2020-05-07 NOTE — Telephone Encounter (Signed)
Spoke to the patient just now and let him know that he can come in one week prior to his appointment to get his labs drawn so that we have them for his appointment the following week. He verbalizes understanding and thanks me for the call back.    Encouraged patient to call back with any questions or concerns.

## 2020-05-07 NOTE — Telephone Encounter (Signed)
Yes CMP lipid profile before visit 1 week

## 2020-05-08 ENCOUNTER — Ambulatory Visit: Payer: Medicare HMO | Admitting: Cardiology

## 2020-05-08 DIAGNOSIS — L570 Actinic keratosis: Secondary | ICD-10-CM | POA: Diagnosis not present

## 2020-05-08 DIAGNOSIS — L538 Other specified erythematous conditions: Secondary | ICD-10-CM | POA: Diagnosis not present

## 2020-05-08 DIAGNOSIS — D2271 Melanocytic nevi of right lower limb, including hip: Secondary | ICD-10-CM | POA: Diagnosis not present

## 2020-05-08 DIAGNOSIS — L821 Other seborrheic keratosis: Secondary | ICD-10-CM | POA: Diagnosis not present

## 2020-05-08 DIAGNOSIS — D485 Neoplasm of uncertain behavior of skin: Secondary | ICD-10-CM | POA: Diagnosis not present

## 2020-05-08 DIAGNOSIS — D2272 Melanocytic nevi of left lower limb, including hip: Secondary | ICD-10-CM | POA: Diagnosis not present

## 2020-05-08 DIAGNOSIS — D225 Melanocytic nevi of trunk: Secondary | ICD-10-CM | POA: Diagnosis not present

## 2020-05-08 DIAGNOSIS — L820 Inflamed seborrheic keratosis: Secondary | ICD-10-CM | POA: Diagnosis not present

## 2020-05-08 DIAGNOSIS — D2262 Melanocytic nevi of left upper limb, including shoulder: Secondary | ICD-10-CM | POA: Diagnosis not present

## 2020-05-08 DIAGNOSIS — D2261 Melanocytic nevi of right upper limb, including shoulder: Secondary | ICD-10-CM | POA: Diagnosis not present

## 2020-05-10 DIAGNOSIS — M6281 Muscle weakness (generalized): Secondary | ICD-10-CM | POA: Diagnosis not present

## 2020-05-10 DIAGNOSIS — M25562 Pain in left knee: Secondary | ICD-10-CM | POA: Diagnosis not present

## 2020-05-15 ENCOUNTER — Telehealth: Payer: Self-pay | Admitting: Cardiology

## 2020-05-15 NOTE — Telephone Encounter (Signed)
Spoke to the patient just now and he let me know he is having chest pain that started at around 1 o'clock. On a scale from 0-10 he would rate it a 2 or 3. He does not know how to describe the pain. He states that this pain is not radiating anywhere. He has not taken any medications at this time to help with this. He does not have a way to take any vital signs for me. He denies being short of breath, dizziness, or nauseous. He states the only other symptom that he has is he is feeling very warm/flushed.   He states that he does not want to go to the Emergency Room at this time. I will route to Dr. Loney Hering and discuss this with him now.

## 2020-05-15 NOTE — Telephone Encounter (Signed)
I spoke with Dr. Bettina Gavia who advised that the patient should proceed to the ED at this time. I spoke with the patient and let him know this recommendation as well but he states that he does not want to go at this time. He requests to see if any of the other doctors had availability this week and I let him know that Dr. Harriet Masson could see him on Thursday and he states that he would like to do this. I got him scheduled and urged him again to go to the ED.    Encouraged patient to call back with any questions or concerns.

## 2020-05-15 NOTE — Telephone Encounter (Signed)
  Pt c/o of Chest Pain: STAT if CP now or developed within 24 hours  1. Are you having CP right now? yes  2. Are you experiencing any other symptoms (ex. SOB, nausea, vomiting, sweating)? no  3. How long have you been experiencing CP? About 1 pm today  4. Is your CP continuous or coming and going? Comes and goes  5. Have you taken Nitroglycerin? no ?

## 2020-05-17 ENCOUNTER — Other Ambulatory Visit: Payer: Self-pay | Admitting: Cardiology

## 2020-05-17 ENCOUNTER — Ambulatory Visit: Payer: Medicare HMO | Admitting: Cardiology

## 2020-05-22 DIAGNOSIS — Z96652 Presence of left artificial knee joint: Secondary | ICD-10-CM | POA: Diagnosis not present

## 2020-06-07 DIAGNOSIS — M159 Polyosteoarthritis, unspecified: Secondary | ICD-10-CM | POA: Diagnosis not present

## 2020-06-07 DIAGNOSIS — I252 Old myocardial infarction: Secondary | ICD-10-CM | POA: Diagnosis not present

## 2020-06-07 DIAGNOSIS — E785 Hyperlipidemia, unspecified: Secondary | ICD-10-CM | POA: Diagnosis not present

## 2020-06-07 DIAGNOSIS — I2510 Atherosclerotic heart disease of native coronary artery without angina pectoris: Secondary | ICD-10-CM | POA: Diagnosis not present

## 2020-06-07 DIAGNOSIS — E291 Testicular hypofunction: Secondary | ICD-10-CM | POA: Diagnosis not present

## 2020-06-07 DIAGNOSIS — K219 Gastro-esophageal reflux disease without esophagitis: Secondary | ICD-10-CM | POA: Diagnosis not present

## 2020-06-07 DIAGNOSIS — N1831 Chronic kidney disease, stage 3a: Secondary | ICD-10-CM | POA: Diagnosis not present

## 2020-06-07 DIAGNOSIS — I10 Essential (primary) hypertension: Secondary | ICD-10-CM | POA: Diagnosis not present

## 2020-06-07 DIAGNOSIS — E663 Overweight: Secondary | ICD-10-CM | POA: Diagnosis not present

## 2020-07-02 ENCOUNTER — Telehealth: Payer: Self-pay | Admitting: Cardiology

## 2020-07-02 NOTE — Telephone Encounter (Signed)
Message left for pt to call back.

## 2020-07-02 NOTE — Telephone Encounter (Signed)
Pt states that he has lt sided chest pain when he takes a deep breath and states that it has been ongoing for awhile. Pt denies any other sx. How do you advise?

## 2020-07-02 NOTE — Telephone Encounter (Signed)
Pt c/o of Chest Pain: STAT if CP now or developed within 24 hours  1. Are you having CP right now?  No   2. Are you experiencing any other symptoms (ex. SOB, nausea, vomiting, sweating)?  No  3. How long have you been experiencing CP?  Past few months  4. Is your CP continuous or coming and going?  Coming and going   5. Have you taken Nitroglycerin?  Patient states he does not have any nitro ?

## 2020-07-02 NOTE — Telephone Encounter (Signed)
VM left for pt with call back number with recommendations of Dr. Bettina Gavia.

## 2020-07-02 NOTE — Telephone Encounter (Signed)
This type of chest pain is best described as pleuritic He had a total knee arthroplasty a few months ago I think it is best that he be seen in the emergency department and need to have lab work done and likely had a chest CTA for pulmonary embolism This is my recommendation.

## 2020-07-03 ENCOUNTER — Other Ambulatory Visit: Payer: Self-pay | Admitting: Specialist

## 2020-07-03 DIAGNOSIS — M5416 Radiculopathy, lumbar region: Secondary | ICD-10-CM

## 2020-07-03 DIAGNOSIS — R29898 Other symptoms and signs involving the musculoskeletal system: Secondary | ICD-10-CM

## 2020-07-03 DIAGNOSIS — M4815 Ankylosing hyperostosis [Forestier], thoracolumbar region: Secondary | ICD-10-CM

## 2020-07-03 DIAGNOSIS — M4726 Other spondylosis with radiculopathy, lumbar region: Secondary | ICD-10-CM

## 2020-07-08 DIAGNOSIS — H4930 Total (external) ophthalmoplegia, unspecified eye: Secondary | ICD-10-CM | POA: Diagnosis not present

## 2020-08-06 DIAGNOSIS — I10 Essential (primary) hypertension: Secondary | ICD-10-CM | POA: Diagnosis not present

## 2020-08-06 DIAGNOSIS — K219 Gastro-esophageal reflux disease without esophagitis: Secondary | ICD-10-CM | POA: Diagnosis not present

## 2020-08-06 DIAGNOSIS — M159 Polyosteoarthritis, unspecified: Secondary | ICD-10-CM | POA: Diagnosis not present

## 2020-08-06 DIAGNOSIS — E291 Testicular hypofunction: Secondary | ICD-10-CM | POA: Diagnosis not present

## 2020-08-06 DIAGNOSIS — N1831 Chronic kidney disease, stage 3a: Secondary | ICD-10-CM | POA: Diagnosis not present

## 2020-08-06 DIAGNOSIS — E785 Hyperlipidemia, unspecified: Secondary | ICD-10-CM | POA: Diagnosis not present

## 2020-08-06 DIAGNOSIS — E669 Obesity, unspecified: Secondary | ICD-10-CM | POA: Diagnosis not present

## 2020-08-06 DIAGNOSIS — I252 Old myocardial infarction: Secondary | ICD-10-CM | POA: Diagnosis not present

## 2020-08-06 DIAGNOSIS — I2510 Atherosclerotic heart disease of native coronary artery without angina pectoris: Secondary | ICD-10-CM | POA: Diagnosis not present

## 2020-08-06 LAB — COMPREHENSIVE METABOLIC PANEL
ALT: 17 IU/L (ref 0–44)
AST: 20 IU/L (ref 0–40)
Albumin/Globulin Ratio: 1.7 (ref 1.2–2.2)
Albumin: 4.4 g/dL (ref 3.7–4.7)
Alkaline Phosphatase: 102 IU/L (ref 44–121)
BUN/Creatinine Ratio: 8 — ABNORMAL LOW (ref 10–24)
BUN: 9 mg/dL (ref 8–27)
Bilirubin Total: 0.3 mg/dL (ref 0.0–1.2)
CO2: 24 mmol/L (ref 20–29)
Calcium: 9.5 mg/dL (ref 8.6–10.2)
Chloride: 101 mmol/L (ref 96–106)
Creatinine, Ser: 1.09 mg/dL (ref 0.76–1.27)
Globulin, Total: 2.6 g/dL (ref 1.5–4.5)
Glucose: 103 mg/dL — ABNORMAL HIGH (ref 65–99)
Potassium: 4.8 mmol/L (ref 3.5–5.2)
Sodium: 138 mmol/L (ref 134–144)
Total Protein: 7 g/dL (ref 6.0–8.5)
eGFR: 72 mL/min/{1.73_m2} (ref >59)

## 2020-08-06 LAB — LIPID PANEL
Chol/HDL Ratio: 4.2 ratio (ref 0.0–5.0)
Cholesterol, Total: 181 mg/dL (ref 100–199)
HDL: 43 mg/dL (ref >39)
LDL Chol Calc (NIH): 92 mg/dL (ref 0–99)
Triglycerides: 272 mg/dL — ABNORMAL HIGH (ref 0–149)
VLDL Cholesterol Cal: 46 mg/dL — ABNORMAL HIGH (ref 5–40)

## 2020-08-07 ENCOUNTER — Telehealth: Payer: Self-pay

## 2020-08-07 NOTE — Telephone Encounter (Signed)
-----   Message from Richardo Priest, MD sent at 08/06/2020  7:27 PM EDT ----- Regarding: FW: Good result no changes in treatment ----- Message ----- From: Interface, [facility] Lab Results In Sent: 08/06/2020   4:36 PM EDT To: Richardo Priest, MD

## 2020-08-07 NOTE — Telephone Encounter (Signed)
Spoke with patient regarding results and recommendation.  Patient verbalizes understanding and is agreeable to plan of care. Advised patient to call back with any issues or concerns.  

## 2020-08-10 ENCOUNTER — Ambulatory Visit: Payer: Medicare HMO | Admitting: Cardiology

## 2020-08-14 DIAGNOSIS — Z1331 Encounter for screening for depression: Secondary | ICD-10-CM | POA: Diagnosis not present

## 2020-08-14 DIAGNOSIS — Z9181 History of falling: Secondary | ICD-10-CM | POA: Diagnosis not present

## 2020-08-14 DIAGNOSIS — E785 Hyperlipidemia, unspecified: Secondary | ICD-10-CM | POA: Diagnosis not present

## 2020-08-14 DIAGNOSIS — Z0000 Encounter for general adult medical examination without abnormal findings: Secondary | ICD-10-CM | POA: Diagnosis not present

## 2020-08-22 NOTE — Progress Notes (Signed)
Cardiology Office Note:    Date:  08/23/2020   ID:  Sean, Marks 12/01/1945, MRN [id number]  PCP:  Cher Nakai, MD  Cardiologist:  Shirlee More, MD    Referring MD: Cher Nakai, MD    ASSESSMENT:    1. CAD in native artery   2. Essential hypertension   3. Mixed hyperlipidemia    PLAN:    In order of problems listed above:  Stable CAD not having typical angina he will continue aspirin statin and his ACE inhibitor.  I have given him a prescription for nitroglycerin and if he has more significant episodes or typical angina he will need an ischemia evaluation Stable BP at target continue ACE inhibitor he has normal renal function Fortunately he tolerates taking his statin every other day continue the same LDL at target   Next appointment: 6 months   Medication Adjustments/Labs and Tests Ordered: Current medicines are reviewed at length with the patient today.  Concerns regarding medicines are outlined above.  No orders of the defined types were placed in this encounter.  Meds ordered this encounter  Medications   nitroGLYCERIN (NITROSTAT) 0.4 MG SL tablet    Sig: Place 1 tablet (0.4 mg total) under the tongue every 5 (five) minutes as needed.    Dispense:  30 tablet    Refill:  3    No chief complaint on file.   History of Present Illness:    Sean Marks is a 75 y.o. male with a hx of CAD status post PCI hypertension and hyperlipidemia last seen 01/25/2020 and preoperative visit prior to left total knee arthroplasty.. Compliance with diet, lifestyle and medications: Yes  Overall he is doing well uncomplicated knee surgery. He does not have typical angina. At times when he has been under the tractor working he is having right chest pain and is also had discomfort in his left chest when he goes from being supine to sitting up.  Its not frequent its not severe he is not needed nitroglycerin. No edema shortness of breath palpitation or syncope.  Recent labs 08/06/2020  showed lipids at target LDL 92 cholesterol 181 triglyceride 272 HDL 43 random glucose 103 creatinine normal at 1.09 Past Medical History:  Diagnosis Date   Asthma    Coronary artery disease    Hyperlipidemia    Hypertension     Past Surgical History:  Procedure Laterality Date   BACK SURGERY     KNEE SURGERY      Current Medications: Current Meds  Medication Sig   aspirin EC 81 MG tablet Take 1 tablet (81 mg total) by mouth every other day.   nitroGLYCERIN (NITROSTAT) 0.4 MG SL tablet Place 1 tablet (0.4 mg total) under the tongue every 5 (five) minutes as needed.   Omega-3 Fatty Acids (FISH OIL) 1000 MG CAPS Take 1 capsule (1,000 mg total) by mouth 2 (two) times daily. (Patient taking differently: Take 1 capsule by mouth every other day.)   omeprazole (PRILOSEC) 40 MG capsule Take 40 mg by mouth every other day.   ramipril (ALTACE) 5 MG capsule Take 1 capsule (5 mg total) by mouth every evening. (Patient taking differently: Take 5 mg by mouth every other day.)   simvastatin (ZOCOR) 40 MG tablet TAKE 1 TABLET BY MOUTH EVERYDAY AT BEDTIME (Patient taking differently: Take 40 mg by mouth every other day.)   Current Facility-Administered Medications for the 08/23/20 encounter (Office Visit) with Richardo Priest, MD  Medication   methylPREDNISolone  acetate (DEPO-MEDROL) injection 80 mg     Allergies:   Oxycodone   Social History   Socioeconomic History   Marital status: Married    Spouse name: Not on file   Number of children: Not on file   Years of education: Not on file   Highest education level: Not on file  Occupational History   Not on file  Tobacco Use   Smoking status: Former    Pack years: 0.00   Smokeless tobacco: Never  Substance and Sexual Activity   Alcohol use: Never   Drug use: Never   Sexual activity: Not on file  Other Topics Concern   Not on file  Social History Narrative   Not on file   Social Determinants of Health   Financial Resource Strain:  Not on file  Food Insecurity: Not on file  Transportation Needs: Not on file  Physical Activity: Not on file  Stress: Not on file  Social Connections: Not on file     Family History: The patient's family history includes Heart disease in his brother, brother, and father. ROS:   Please see the history of present illness.    All other systems reviewed and are negative.  EKGs/Labs/Other Studies Reviewed:    The following studies were reviewed today:    Recent Labs: 08/06/2020: ALT 17; BUN 9; Creatinine, Ser 1.09; Potassium 4.8; Sodium 138  Recent Lipid Panel    Component Value Date/Time   CHOL 181 08/06/2020 0940   TRIG 272 (H) 08/06/2020 0940   HDL 43 08/06/2020 0940   CHOLHDL 4.2 08/06/2020 0940   LDLCALC 92 08/06/2020 0940    Physical Exam:    VS:  BP (!) 110/58 (BP Location: Left Arm, Patient Position: Sitting, Cuff Size: Normal)   Pulse 70   Ht 5\' 11"  (1.803 m)   Wt 208 lb (94.3 kg)   SpO2 96%   BMI 29.01 kg/m     Wt Readings from Last 3 Encounters:  08/23/20 208 lb (94.3 kg)  01/26/20 212 lb (96.2 kg)  01/25/20 212 lb 6.4 oz (96.3 kg)     GEN:  Well nourished, well developed in no acute distress HEENT: Normal NECK: No JVD; No carotid bruits LYMPHATICS: No lymphadenopathy CARDIAC: RRR, no murmurs, rubs, gallops RESPIRATORY:  Clear to auscultation without rales, wheezing or rhonchi  ABDOMEN: Soft, non-tender, non-distended MUSCULOSKELETAL:  No edema; No deformity  SKIN: Warm and dry NEUROLOGIC:  Alert and oriented x 3 PSYCHIATRIC:  Normal affect    Signed, Shirlee More, MD  08/23/2020 9:13 AM    [place]

## 2020-08-23 ENCOUNTER — Ambulatory Visit: Payer: Medicare HMO | Admitting: Cardiology

## 2020-08-23 ENCOUNTER — Other Ambulatory Visit: Payer: Self-pay

## 2020-08-23 ENCOUNTER — Encounter: Payer: Self-pay | Admitting: Cardiology

## 2020-08-23 VITALS — BP 110/58 | HR 70 | Ht 71.0 in | Wt 208.0 lb

## 2020-08-23 DIAGNOSIS — I2510 Atherosclerotic heart disease of native coronary artery without angina pectoris: Secondary | ICD-10-CM | POA: Diagnosis not present

## 2020-08-23 DIAGNOSIS — E782 Mixed hyperlipidemia: Secondary | ICD-10-CM

## 2020-08-23 DIAGNOSIS — I10 Essential (primary) hypertension: Secondary | ICD-10-CM | POA: Diagnosis not present

## 2020-08-23 MED ORDER — NITROGLYCERIN 0.4 MG SL SUBL: 0.4000 mg | SUBLINGUAL_TABLET | SUBLINGUAL | 3 refills | Status: DC | PRN

## 2020-08-23 NOTE — Patient Instructions (Signed)
Medication Instructions:  Your physician has recommended you make the following change in your medication:  START: Nitroglycerin 0.4 mg take one tablet by mouth every 5 minutes up to three times as needed for chest pain.  *If you need a refill on your cardiac medications before your next appointment, please call your pharmacy*   Lab Work: None If you have labs (blood work) drawn today and your tests are completely normal, you will receive your results only by: [place] (if you have MyChart) OR A paper copy in the mail If you have any lab test that is abnormal or we need to change your treatment, we will call you to review the results.   Testing/Procedures: None   Follow-Up: At [facility], you and your health needs are our priority.  As part of our continuing mission to provide you with exceptional heart care, we have created designated Provider Care Teams.  These Care Teams include your primary Cardiologist (physician) and Advanced Practice Providers (APPs -  Physician Assistants and Nurse Practitioners) who all work together to provide you with the care you need, when you need it.  We recommend signing up for the patient portal called "MyChart".  Sign up information is provided on this After Visit Summary.  MyChart is used to connect with patients for Virtual Visits (Telemedicine).  Patients are able to view lab/test results, encounter notes, upcoming appointments, etc.  Non-urgent messages can be sent to your provider as well.   To learn more about what you can do with MyChart, go to [email or web address].    Your next appointment:   6 month(s)  The format for your next appointment:   In Person  Provider:   Shirlee More, MD   Other Instructions

## 2020-09-03 DIAGNOSIS — E669 Obesity, unspecified: Secondary | ICD-10-CM | POA: Diagnosis not present

## 2020-09-03 DIAGNOSIS — E785 Hyperlipidemia, unspecified: Secondary | ICD-10-CM | POA: Diagnosis not present

## 2020-09-03 DIAGNOSIS — I10 Essential (primary) hypertension: Secondary | ICD-10-CM | POA: Diagnosis not present

## 2020-09-03 DIAGNOSIS — I252 Old myocardial infarction: Secondary | ICD-10-CM | POA: Diagnosis not present

## 2020-09-03 DIAGNOSIS — E291 Testicular hypofunction: Secondary | ICD-10-CM | POA: Diagnosis not present

## 2020-09-03 DIAGNOSIS — N1831 Chronic kidney disease, stage 3a: Secondary | ICD-10-CM | POA: Diagnosis not present

## 2020-09-03 DIAGNOSIS — I2510 Atherosclerotic heart disease of native coronary artery without angina pectoris: Secondary | ICD-10-CM | POA: Diagnosis not present

## 2020-09-03 DIAGNOSIS — Z6829 Body mass index (BMI) 29.0-29.9, adult: Secondary | ICD-10-CM | POA: Diagnosis not present

## 2020-09-03 DIAGNOSIS — M159 Polyosteoarthritis, unspecified: Secondary | ICD-10-CM | POA: Diagnosis not present

## 2020-09-03 DIAGNOSIS — K219 Gastro-esophageal reflux disease without esophagitis: Secondary | ICD-10-CM | POA: Diagnosis not present

## 2020-09-11 DIAGNOSIS — K219 Gastro-esophageal reflux disease without esophagitis: Secondary | ICD-10-CM | POA: Diagnosis not present

## 2020-09-11 DIAGNOSIS — I10 Essential (primary) hypertension: Secondary | ICD-10-CM | POA: Diagnosis not present

## 2020-09-11 DIAGNOSIS — Z6829 Body mass index (BMI) 29.0-29.9, adult: Secondary | ICD-10-CM | POA: Diagnosis not present

## 2020-09-11 DIAGNOSIS — J069 Acute upper respiratory infection, unspecified: Secondary | ICD-10-CM | POA: Diagnosis not present

## 2020-09-26 DIAGNOSIS — E291 Testicular hypofunction: Secondary | ICD-10-CM | POA: Diagnosis not present

## 2020-09-26 DIAGNOSIS — I10 Essential (primary) hypertension: Secondary | ICD-10-CM | POA: Diagnosis not present

## 2020-09-26 DIAGNOSIS — N1831 Chronic kidney disease, stage 3a: Secondary | ICD-10-CM | POA: Diagnosis not present

## 2020-09-26 DIAGNOSIS — E669 Obesity, unspecified: Secondary | ICD-10-CM | POA: Diagnosis not present

## 2020-09-26 DIAGNOSIS — N529 Male erectile dysfunction, unspecified: Secondary | ICD-10-CM | POA: Diagnosis not present

## 2020-09-26 DIAGNOSIS — Z6830 Body mass index (BMI) 30.0-30.9, adult: Secondary | ICD-10-CM | POA: Diagnosis not present

## 2020-09-26 DIAGNOSIS — K219 Gastro-esophageal reflux disease without esophagitis: Secondary | ICD-10-CM | POA: Diagnosis not present

## 2020-09-26 DIAGNOSIS — I2510 Atherosclerotic heart disease of native coronary artery without angina pectoris: Secondary | ICD-10-CM | POA: Diagnosis not present

## 2020-09-26 DIAGNOSIS — M159 Polyosteoarthritis, unspecified: Secondary | ICD-10-CM | POA: Diagnosis not present

## 2020-09-26 DIAGNOSIS — I252 Old myocardial infarction: Secondary | ICD-10-CM | POA: Diagnosis not present

## 2020-10-04 DIAGNOSIS — H353131 Nonexudative age-related macular degeneration, bilateral, early dry stage: Secondary | ICD-10-CM | POA: Diagnosis not present

## 2020-10-12 DIAGNOSIS — H5203 Hypermetropia, bilateral: Secondary | ICD-10-CM | POA: Diagnosis not present

## 2020-10-12 DIAGNOSIS — H524 Presbyopia: Secondary | ICD-10-CM | POA: Diagnosis not present

## 2020-10-12 DIAGNOSIS — H52209 Unspecified astigmatism, unspecified eye: Secondary | ICD-10-CM | POA: Diagnosis not present

## 2020-11-05 DIAGNOSIS — I252 Old myocardial infarction: Secondary | ICD-10-CM | POA: Diagnosis not present

## 2020-11-05 DIAGNOSIS — N1831 Chronic kidney disease, stage 3a: Secondary | ICD-10-CM | POA: Diagnosis not present

## 2020-11-05 DIAGNOSIS — M159 Polyosteoarthritis, unspecified: Secondary | ICD-10-CM | POA: Diagnosis not present

## 2020-11-05 DIAGNOSIS — K219 Gastro-esophageal reflux disease without esophagitis: Secondary | ICD-10-CM | POA: Diagnosis not present

## 2020-11-05 DIAGNOSIS — E291 Testicular hypofunction: Secondary | ICD-10-CM | POA: Diagnosis not present

## 2020-11-05 DIAGNOSIS — E669 Obesity, unspecified: Secondary | ICD-10-CM | POA: Diagnosis not present

## 2020-11-05 DIAGNOSIS — I10 Essential (primary) hypertension: Secondary | ICD-10-CM | POA: Diagnosis not present

## 2020-11-05 DIAGNOSIS — E785 Hyperlipidemia, unspecified: Secondary | ICD-10-CM | POA: Diagnosis not present

## 2020-11-05 DIAGNOSIS — N529 Male erectile dysfunction, unspecified: Secondary | ICD-10-CM | POA: Diagnosis not present

## 2020-11-05 DIAGNOSIS — I2510 Atherosclerotic heart disease of native coronary artery without angina pectoris: Secondary | ICD-10-CM | POA: Diagnosis not present

## 2020-11-27 DIAGNOSIS — F0390 Unspecified dementia without behavioral disturbance: Secondary | ICD-10-CM | POA: Diagnosis not present

## 2020-11-27 DIAGNOSIS — E291 Testicular hypofunction: Secondary | ICD-10-CM | POA: Diagnosis not present

## 2020-11-27 DIAGNOSIS — Z23 Encounter for immunization: Secondary | ICD-10-CM | POA: Diagnosis not present

## 2020-11-27 DIAGNOSIS — I10 Essential (primary) hypertension: Secondary | ICD-10-CM | POA: Diagnosis not present

## 2020-11-27 DIAGNOSIS — R69 Illness, unspecified: Secondary | ICD-10-CM | POA: Diagnosis not present

## 2020-11-27 DIAGNOSIS — K219 Gastro-esophageal reflux disease without esophagitis: Secondary | ICD-10-CM | POA: Diagnosis not present

## 2020-11-27 DIAGNOSIS — I252 Old myocardial infarction: Secondary | ICD-10-CM | POA: Diagnosis not present

## 2020-11-27 DIAGNOSIS — N529 Male erectile dysfunction, unspecified: Secondary | ICD-10-CM | POA: Diagnosis not present

## 2020-11-27 DIAGNOSIS — E785 Hyperlipidemia, unspecified: Secondary | ICD-10-CM | POA: Diagnosis not present

## 2020-11-27 DIAGNOSIS — E669 Obesity, unspecified: Secondary | ICD-10-CM | POA: Diagnosis not present

## 2020-11-27 DIAGNOSIS — M159 Polyosteoarthritis, unspecified: Secondary | ICD-10-CM | POA: Diagnosis not present

## 2020-11-27 DIAGNOSIS — N1831 Chronic kidney disease, stage 3a: Secondary | ICD-10-CM | POA: Diagnosis not present

## 2020-11-27 DIAGNOSIS — I2510 Atherosclerotic heart disease of native coronary artery without angina pectoris: Secondary | ICD-10-CM | POA: Diagnosis not present

## 2020-12-25 DIAGNOSIS — N1831 Chronic kidney disease, stage 3a: Secondary | ICD-10-CM | POA: Diagnosis not present

## 2020-12-25 DIAGNOSIS — I10 Essential (primary) hypertension: Secondary | ICD-10-CM | POA: Diagnosis not present

## 2020-12-25 DIAGNOSIS — Z6830 Body mass index (BMI) 30.0-30.9, adult: Secondary | ICD-10-CM | POA: Diagnosis not present

## 2020-12-25 DIAGNOSIS — K219 Gastro-esophageal reflux disease without esophagitis: Secondary | ICD-10-CM | POA: Diagnosis not present

## 2020-12-25 DIAGNOSIS — N529 Male erectile dysfunction, unspecified: Secondary | ICD-10-CM | POA: Diagnosis not present

## 2020-12-25 DIAGNOSIS — R69 Illness, unspecified: Secondary | ICD-10-CM | POA: Diagnosis not present

## 2020-12-25 DIAGNOSIS — M159 Polyosteoarthritis, unspecified: Secondary | ICD-10-CM | POA: Diagnosis not present

## 2020-12-25 DIAGNOSIS — I252 Old myocardial infarction: Secondary | ICD-10-CM | POA: Diagnosis not present

## 2020-12-25 DIAGNOSIS — E669 Obesity, unspecified: Secondary | ICD-10-CM | POA: Diagnosis not present

## 2020-12-25 DIAGNOSIS — I2510 Atherosclerotic heart disease of native coronary artery without angina pectoris: Secondary | ICD-10-CM | POA: Diagnosis not present

## 2020-12-25 DIAGNOSIS — E785 Hyperlipidemia, unspecified: Secondary | ICD-10-CM | POA: Diagnosis not present

## 2020-12-25 DIAGNOSIS — E291 Testicular hypofunction: Secondary | ICD-10-CM | POA: Diagnosis not present

## 2020-12-25 DIAGNOSIS — F0390 Unspecified dementia without behavioral disturbance: Secondary | ICD-10-CM | POA: Diagnosis not present

## 2021-01-07 DIAGNOSIS — N1831 Chronic kidney disease, stage 3a: Secondary | ICD-10-CM | POA: Diagnosis not present

## 2021-01-07 DIAGNOSIS — I252 Old myocardial infarction: Secondary | ICD-10-CM | POA: Diagnosis not present

## 2021-01-07 DIAGNOSIS — K219 Gastro-esophageal reflux disease without esophagitis: Secondary | ICD-10-CM | POA: Diagnosis not present

## 2021-01-07 DIAGNOSIS — I10 Essential (primary) hypertension: Secondary | ICD-10-CM | POA: Diagnosis not present

## 2021-01-07 DIAGNOSIS — N529 Male erectile dysfunction, unspecified: Secondary | ICD-10-CM | POA: Diagnosis not present

## 2021-01-07 DIAGNOSIS — E785 Hyperlipidemia, unspecified: Secondary | ICD-10-CM | POA: Diagnosis not present

## 2021-01-07 DIAGNOSIS — R69 Illness, unspecified: Secondary | ICD-10-CM | POA: Diagnosis not present

## 2021-01-07 DIAGNOSIS — I2510 Atherosclerotic heart disease of native coronary artery without angina pectoris: Secondary | ICD-10-CM | POA: Diagnosis not present

## 2021-01-07 DIAGNOSIS — M159 Polyosteoarthritis, unspecified: Secondary | ICD-10-CM | POA: Diagnosis not present

## 2021-01-07 DIAGNOSIS — E291 Testicular hypofunction: Secondary | ICD-10-CM | POA: Diagnosis not present

## 2021-03-03 NOTE — Progress Notes (Signed)
Cardiology Office Note:    Date:  03/04/2021   ID:  Sean Marks, DOB Feb 11, 1946, MRN [id number]  PCP:  Cher Nakai, MD  Cardiologist:  Shirlee More, MD    Referring MD: Cher Nakai, MD    ASSESSMENT:    1. CAD in native artery   2. Essential hypertension   3. Mixed hyperlipidemia   4. Memory impairment    PLAN:    In order of problems listed above:  Sean Marks continues to do well from the perspective of CAD after remote PCI x2 left circumflex coronary artery and on current medical treatment including aspirin statin and over-the-counter fish oil supplement.  At this time do not think he requires an ischemic evaluation Stable at target continue low-dose ACE inhibitor Stable tolerating his statin lipids at target continue simvastatin over-the-counter fish oil Is quite concerned by deterioration in his memory he took Aricept and had nightmares stopped he asked me if I could refer him to a neurologist so that he can have a diagnosis I gave him the name of Dr. Delfino Lovett S.ater.   Next appointment: 9 months   Medication Adjustments/Labs and Tests Ordered: Current medicines are reviewed at length with the patient today.  Concerns regarding medicines are outlined above.  No orders of the defined types were placed in this encounter.  No orders of the defined types were placed in this encounter.   Chief Complaint  Patient presents with   Follow-up   Coronary Artery Disease    History of Present Illness:    Sean Marks is a 76 y.o. male with a hx of CAD with previous remote PCI of circumflex coronary artery hypertension and hyperlipidemia last seen 08/23/2020.  His last ischemic evaluation 01/26/2020 showed normal left ventricular ejection fraction and function and normal myocardial perfusion.  Compliance with diet, lifestyle and medications: Yes  Cardiology perspective [place] continues to do well no angina dyspnea palpitations or syncope.  He tolerates his statin without muscle pain  or weakness most recent profile on target cholesterol 162 LDL 80 HDL 40 triglycerides 225 11/05/2020 Past Medical History:  Diagnosis Date   Asthma    Coronary artery disease    Hyperlipidemia    Hypertension     Past Surgical History:  Procedure Laterality Date   BACK SURGERY     KNEE SURGERY      Current Medications: Current Meds  Medication Sig   aspirin EC 81 MG tablet Take 1 tablet (81 mg total) by mouth every other day.   nitroGLYCERIN (NITROSTAT) 0.4 MG SL tablet Place 1 tablet (0.4 mg total) under the tongue every 5 (five) minutes as needed.   Omega-3 Fatty Acids (FISH OIL) 1000 MG CAPS Take 1 capsule (1,000 mg total) by mouth 2 (two) times daily. (Patient taking differently: Take 1,000 mg by mouth in the morning and at bedtime.)   omeprazole (PRILOSEC) 40 MG capsule Take 40 mg by mouth every other day.   ramipril (ALTACE) 5 MG capsule Take 1 capsule (5 mg total) by mouth every evening. (Patient taking differently: Take 5 mg by mouth every other day.)   simvastatin (ZOCOR) 40 MG tablet TAKE 1 TABLET BY MOUTH EVERYDAY AT BEDTIME (Patient taking differently: Take 40 mg by mouth every other day.)   Current Facility-Administered Medications for the 03/04/21 encounter (Office Visit) with Richardo Priest, MD  Medication   methylPREDNISolone acetate (DEPO-MEDROL) injection 80 mg     Allergies:   Oxycodone   Social History   Socioeconomic  History   Marital status: Married    Spouse name: Not on file   Number of children: Not on file   Years of education: Not on file   Highest education level: Not on file  Occupational History   Not on file  Tobacco Use   Smoking status: Former    Passive exposure: Never   Smokeless tobacco: Never  Vaping Use   Vaping Use: Never used  Substance and Sexual Activity   Alcohol use: Never   Drug use: Never   Sexual activity: Not on file  Other Topics Concern   Not on file  Social History Narrative   Not on file   Social Determinants  of Health   Financial Resource Strain: Not on file  Food Insecurity: Not on file  Transportation Needs: Not on file  Physical Activity: Not on file  Stress: Not on file  Social Connections: Not on file     Family History: The patient's family history includes Heart disease in his brother, brother, and father. ROS:   Please see the history of present illness.    All other systems reviewed and are negative.  EKGs/Labs/Other Studies Reviewed:    The following studies were reviewed today:  EKG:  EKG ordered today and personally reviewed.  The ekg ordered today demonstrates sinus rhythm normal EKG occasional APCs  Recent Labs: 08/06/2020: ALT 17; BUN 9; Creatinine, Ser 1.09; Potassium 4.8; Sodium 138  Recent Lipid Panel    Component Value Date/Time   CHOL 181 08/06/2020 0940   TRIG 272 (H) 08/06/2020 0940   HDL 43 08/06/2020 0940   CHOLHDL 4.2 08/06/2020 0940   LDLCALC 92 08/06/2020 0940    Physical Exam:    VS:  BP 126/60    Pulse 61    Ht 5\' 11"  (1.803 m)    Wt 217 lb (98.4 kg)    SpO2 98%    BMI 30.27 kg/m     Wt Readings from Last 3 Encounters:  03/04/21 217 lb (98.4 kg)  08/23/20 208 lb (94.3 kg)  01/26/20 212 lb (96.2 kg)     GEN:  Well nourished, well developed in no acute distress HEENT: Normal NECK: No JVD; No carotid bruits LYMPHATICS: No lymphadenopathy CARDIAC: RRR, no murmurs, rubs, gallops RESPIRATORY:  Clear to auscultation without rales, wheezing or rhonchi  ABDOMEN: Soft, non-tender, non-distended MUSCULOSKELETAL:  No edema; No deformity  SKIN: Warm and dry NEUROLOGIC:  Alert and oriented x 3 PSYCHIATRIC:  Normal affect    Signed, Shirlee More, MD  03/04/2021 8:41 AM    [place]

## 2021-03-04 ENCOUNTER — Ambulatory Visit: Payer: Medicare HMO | Admitting: Cardiology

## 2021-03-04 ENCOUNTER — Other Ambulatory Visit: Payer: Self-pay

## 2021-03-04 ENCOUNTER — Encounter: Payer: Self-pay | Admitting: Cardiology

## 2021-03-04 VITALS — BP 126/60 | HR 61 | Ht 71.0 in | Wt 217.0 lb

## 2021-03-04 DIAGNOSIS — I10 Essential (primary) hypertension: Secondary | ICD-10-CM

## 2021-03-04 DIAGNOSIS — R413 Other amnesia: Secondary | ICD-10-CM | POA: Diagnosis not present

## 2021-03-04 DIAGNOSIS — I2510 Atherosclerotic heart disease of native coronary artery without angina pectoris: Secondary | ICD-10-CM

## 2021-03-04 DIAGNOSIS — E782 Mixed hyperlipidemia: Secondary | ICD-10-CM | POA: Diagnosis not present

## 2021-03-04 NOTE — Patient Instructions (Signed)

## 2021-03-11 DIAGNOSIS — M159 Polyosteoarthritis, unspecified: Secondary | ICD-10-CM | POA: Diagnosis not present

## 2021-03-11 DIAGNOSIS — I10 Essential (primary) hypertension: Secondary | ICD-10-CM | POA: Diagnosis not present

## 2021-03-11 DIAGNOSIS — I2510 Atherosclerotic heart disease of native coronary artery without angina pectoris: Secondary | ICD-10-CM | POA: Diagnosis not present

## 2021-03-11 DIAGNOSIS — I252 Old myocardial infarction: Secondary | ICD-10-CM | POA: Diagnosis not present

## 2021-03-11 DIAGNOSIS — Z139 Encounter for screening, unspecified: Secondary | ICD-10-CM | POA: Diagnosis not present

## 2021-03-11 DIAGNOSIS — N529 Male erectile dysfunction, unspecified: Secondary | ICD-10-CM | POA: Diagnosis not present

## 2021-03-11 DIAGNOSIS — K219 Gastro-esophageal reflux disease without esophagitis: Secondary | ICD-10-CM | POA: Diagnosis not present

## 2021-03-11 DIAGNOSIS — E291 Testicular hypofunction: Secondary | ICD-10-CM | POA: Diagnosis not present

## 2021-03-11 DIAGNOSIS — R69 Illness, unspecified: Secondary | ICD-10-CM | POA: Diagnosis not present

## 2021-03-11 DIAGNOSIS — E785 Hyperlipidemia, unspecified: Secondary | ICD-10-CM | POA: Diagnosis not present

## 2021-03-11 DIAGNOSIS — N1831 Chronic kidney disease, stage 3a: Secondary | ICD-10-CM | POA: Diagnosis not present

## 2021-03-19 ENCOUNTER — Telehealth: Payer: Self-pay | Admitting: Cardiology

## 2021-03-19 DIAGNOSIS — R413 Other amnesia: Secondary | ICD-10-CM

## 2021-03-19 NOTE — Telephone Encounter (Signed)
Referral has been placed at this time.

## 2021-03-19 NOTE — Telephone Encounter (Signed)
Patient calling in asking that we send a referral to Dr. Arlice Colt (neurologist)

## 2021-04-09 DIAGNOSIS — I10 Essential (primary) hypertension: Secondary | ICD-10-CM | POA: Diagnosis not present

## 2021-04-09 DIAGNOSIS — Z0000 Encounter for general adult medical examination without abnormal findings: Secondary | ICD-10-CM | POA: Diagnosis not present

## 2021-04-09 DIAGNOSIS — Z6830 Body mass index (BMI) 30.0-30.9, adult: Secondary | ICD-10-CM | POA: Diagnosis not present

## 2021-04-09 DIAGNOSIS — Z125 Encounter for screening for malignant neoplasm of prostate: Secondary | ICD-10-CM | POA: Diagnosis not present

## 2021-05-07 DIAGNOSIS — E785 Hyperlipidemia, unspecified: Secondary | ICD-10-CM | POA: Diagnosis not present

## 2021-05-07 DIAGNOSIS — M159 Polyosteoarthritis, unspecified: Secondary | ICD-10-CM | POA: Diagnosis not present

## 2021-05-07 DIAGNOSIS — I2510 Atherosclerotic heart disease of native coronary artery without angina pectoris: Secondary | ICD-10-CM | POA: Diagnosis not present

## 2021-05-07 DIAGNOSIS — N529 Male erectile dysfunction, unspecified: Secondary | ICD-10-CM | POA: Diagnosis not present

## 2021-05-07 DIAGNOSIS — I252 Old myocardial infarction: Secondary | ICD-10-CM | POA: Diagnosis not present

## 2021-05-07 DIAGNOSIS — I10 Essential (primary) hypertension: Secondary | ICD-10-CM | POA: Diagnosis not present

## 2021-05-07 DIAGNOSIS — K219 Gastro-esophageal reflux disease without esophagitis: Secondary | ICD-10-CM | POA: Diagnosis not present

## 2021-05-07 DIAGNOSIS — N1831 Chronic kidney disease, stage 3a: Secondary | ICD-10-CM | POA: Diagnosis not present

## 2021-05-07 DIAGNOSIS — E291 Testicular hypofunction: Secondary | ICD-10-CM | POA: Diagnosis not present

## 2021-05-07 DIAGNOSIS — R69 Illness, unspecified: Secondary | ICD-10-CM | POA: Diagnosis not present

## 2021-05-16 ENCOUNTER — Ambulatory Visit: Payer: Medicare HMO | Admitting: Neurology

## 2021-05-16 ENCOUNTER — Encounter: Payer: Self-pay | Admitting: Neurology

## 2021-05-16 VITALS — BP 113/84 | HR 62 | Ht 71.0 in | Wt 214.0 lb

## 2021-05-16 DIAGNOSIS — R413 Other amnesia: Secondary | ICD-10-CM | POA: Diagnosis not present

## 2021-05-16 MED ORDER — GALANTAMINE HYDROBROMIDE 4 MG PO TABS: 4.0000 mg | ORAL_TABLET | Freq: Two times a day (BID) | ORAL | 5 refills | Status: DC

## 2021-05-16 NOTE — Progress Notes (Signed)
? ?[facility] ? ?PATIENT: Sean Marks ?DOB: 07-18-1945 ? ?REFERRING DOCTOR OR PCP: Shirlee More, MD; Cher Nakai MD (PCP) ?SOURCE: Patient, notes from other doctors, lab results, cognitive testing. ? ?_________________________________ ? ? ?HISTORICAL ? ?CHIEF COMPLAINT:  ?Chief Complaint  ?Patient presents with  ? New Patient (Initial Visit)  ?  Rm 1, w wife Mae. Pt referred for eval for memory difficulties. Pt states last 2-3 years he has noticed memory loss. Both short and long term. MOCA:20  ? ? ?HISTORY OF PRESENT ILLNESS:  ?I had the pleasure of seeing your patient, Sean Marks, at [facility] for neurologic consultation regarding his gradual memory decline over the past several years ? ?He is a pleasant 76 year old man who first noted mild memory issues about 4 years ago.  His wife agrees with the timeline.  There has not been any sudden change but he has gradually progressed.  More recently, directions with driving are more difficult but he has not been lost.    He has no difficulties with most activities of daily living.  The memory and other cognitive decline has not been associated with any physical change.   He has not had any imaging of his head.    ? ?He took donepezil but had nightmares on donepezil and stopped.   ? ?He is fairly active.  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  He spends some of his day doing chores. ? ?He is otherwise in good health.   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    ? ?Both of his parents were cognitively sharp until they dies of old age.  His brothers and sisters have no cognitive  Two uncles had Alzheimer's disease.    ? ? ? ?  05/16/2021  ? 10:28 AM  ?Montreal Cognitive Assessment   ?Visuospatial/ Executive (0/5) 3  ?Naming (0/3) 3  ?Attention: Read list of digits (0/2) 2  ?Attention: Read list of letters (0/1) 1  ?Attention: Serial 7 subtraction  starting at 100 (0/3) 1  ?Language: Repeat phrase (0/2) 2  ?Language : Fluency (0/1) 0  ?Abstraction (0/2) 2  ?Delayed Recall (0/5) 0  ?Orientation (0/6) 6  ?Total 20  ? ? ? ?REVIEW OF SYSTEMS: ?Constitutional: No fevers, chills, sweats, or change in appetite ?Eyes: No visual changes, double vision, eye pain ?Ear, nose and throat: No hearing loss, ear pain, nasal congestion, sore throat ?Cardiovascular: No chest pain, palpitations ?Respiratory:  No shortness of breath at rest or with exertion.   No wheezes ?GastrointestinaI: No nausea, vomiting, diarrhea, abdominal pain, fecal incontinence ?Genitourinary:  No dysuria, urinary retention or frequency.  No nocturia. ?Musculoskeletal:  No neck pain, back pain ?Integumentary: No rash, pruritus, skin lesions ?Neurological: as above ?Psychiatric: No depression at this time.  No anxiety ?Endocrine: No palpitations, diaphoresis, change in appetite, change in weigh or increased thirst ?Hematologic/Lymphatic:  No anemia, purpura, petechiae. ?Allergic/Immunologic: No itchy/runny eyes, nasal congestion, recent allergic reactions, rashes ? ?ALLERGIES: ?Allergies  ?Allergen Reactions  ? Oxycodone Nausea Only  ? ? ?HOME MEDICATIONS: ? ?Current Outpatient Medications:  ?  aspirin EC 81 MG tablet, Take 1 tablet (81 mg total) by mouth every other day., Disp: 90 tablet, Rfl: 2 ?  Omega-3 Fatty Acids (FISH OIL) 1000 MG CAPS, Take 1 capsule (1,000 mg total) by mouth 2 (two) times daily. (Patient taking differently: Take 1,000  mg by mouth in the morning and at bedtime.), Disp: 180 capsule, Rfl: 1 ?  omeprazole (PRILOSEC) 40 MG capsule, Take 40 mg by mouth every other day., Disp: , Rfl:  ?  ramipril (ALTACE) 5 MG capsule, Take 1 capsule (5 mg total) by mouth every evening. (Patient taking differently: Take 5 mg by mouth every other day.), Disp: 90 capsule, Rfl: 1 ?  simvastatin (ZOCOR) 40 MG tablet, TAKE 1 TABLET BY MOUTH EVERYDAY AT BEDTIME (Patient taking differently: Take 40 mg by mouth  every other day.), Disp: 90 tablet, Rfl: 2 ?  nitroGLYCERIN (NITROSTAT) 0.4 MG SL tablet, Place 1 tablet (0.4 mg total) under the tongue every 5 (five) minutes as needed., Disp: 30 tablet, Rfl: 3 ? ?Current Facility-Administered Medications:  ?  methylPREDNISolone acetate (DEPO-MEDROL) injection 80 mg, 80 mg, Intramuscular, Once, Lanae Crumbly, PA-C ? ?PAST MEDICAL HISTORY: ?Past Medical History:  ?Diagnosis Date  ? Asthma   ? Coronary artery disease   ? Hyperlipidemia   ? Hypertension   ? ? ?PAST SURGICAL HISTORY: ?Past Surgical History:  ?Procedure Laterality Date  ? BACK SURGERY    ? KNEE SURGERY    ? ? ?FAMILY HISTORY: ?Family History  ?Problem Relation Age of Onset  ? Heart disease Father   ? Heart disease Brother   ? Heart disease Brother   ? ? ?SOCIAL HISTORY: ? ?Social History  ? ?Socioeconomic History  ? Marital status: Married  ?  Spouse name: Mae  ? Number of children: 1  ? Years of education: Not on file  ? Highest education level: High school graduate  ?Occupational History  ? Not on file  ?Tobacco Use  ? Smoking status: Former  ?  Passive exposure: Never  ? Smokeless tobacco: Never  ?Vaping Use  ? Vaping Use: Never used  ?Substance and Sexual Activity  ? Alcohol use: Never  ? Drug use: Never  ? Sexual activity: Not on file  ?Other Topics Concern  ? Not on file  ?Social History Narrative  ? Lives at home w wife  ? Right handed  ? Caffeine: 1 C of coffee and mountain dew a day. Tea sometimes  ? ?Social Determinants of Health  ? ?Financial Resource Strain: Not on file  ?Food Insecurity: Not on file  ?Transportation Needs: Not on file  ?Physical Activity: Not on file  ?Stress: Not on file  ?Social Connections: Not on file  ?Intimate Partner Violence: Not on file  ? ? ? ?PHYSICAL EXAM ? ?Vitals:  ? 05/16/21 1013  ?BP: 113/84  ?Pulse: 62  ?Weight: 214 lb (97.1 kg)  ?Height: '5\' 11"'$  (1.803 m)  ? ? ?Body mass index is 29.85 kg/m?. ? ? ?General: The patient is well-developed and well-nourished and in no acute  distress ? ?HEENT:  Head is [place]/AT.  Sclera are anicteric.   ? ?Neck: No carotid bruits are noted.  The neck is nontender. ? ?Cardiovascular: The heart has a regular rate and rhythm with a normal S1 and S2. There were no murmurs, gallops or rubs.   ? ?Skin: Extremities are without rash or  edema. ? ?Musculoskeletal:  Back is nontender ? ?Neurologic Exam ? ?Mental status: The patient is alert and oriented x 3 at the time of the examination. The patient has apparent normal recent and remote memory, with an apparently normal attention span and concentration ability.   Speech is normal. ? ?Cranial nerves: Extraocular movements are full.  Facial strength and sensation was normal. ?  The  tongue is midline, and the patient has symmetric elevation of the soft palate. No obvious hearing deficits are noted. ? ?Motor:  Muscle bulk is normal.   Tone is normal. Strength is  5 / 5 in all 4 extremities.  ? ?Sensory: Sensory testing is intact to pinprick, soft touch and vibration sensation in all 4 extremities. ? ?Coordination: Cerebellar testing reveals good finger-nose-finger and heel-to-shin bilaterally. ? ?Gait and station: Station is normal.   Gait is normal. Tandem gait is mildly wide.. Romberg is negative.  ? ?Reflexes: Deep tendon reflexes are symmetric and normal bilaterally.   Plantar responses are flexor. ? ? ? ?DIAGNOSTIC DATA (LABS, IMAGING, TESTING) ?- I reviewed patient records, labs, notes, testing and imaging myself where available. ? ?Lab Results  ?Component Value Date  ? WBC 13.0 (H) 07/31/2009  ? HGB 14.4 07/31/2009  ? HCT 41.7 07/31/2009  ? MCV 91.1 07/31/2009  ? PLT 182 07/31/2009  ? ?   ?Component Value Date/Time  ? NA 138 08/06/2020 0940  ? K 4.8 08/06/2020 0940  ? CL 101 08/06/2020 0940  ? CO2 24 08/06/2020 0940  ? GLUCOSE 103 (H) 08/06/2020 0940  ? GLUCOSE 86 05/05/2019 1144  ? BUN 9 08/06/2020 0940  ? CREATININE 1.09 08/06/2020 0940  ? CREATININE 1.28 (H) 05/05/2019 1144  ? CALCIUM 9.5 08/06/2020 0940  ?  PROT 7.0 08/06/2020 0940  ? ALBUMIN 4.4 08/06/2020 0940  ? AST 20 08/06/2020 0940  ? ALT 17 08/06/2020 0940  ? ALKPHOS 102 08/06/2020 0940  ? BILITOT 0.3 08/06/2020 0940  ? GFRNONAA 56 (L) 05/05/2019 1

## 2021-05-17 LAB — THYROID PANEL WITH TSH
Free Thyroxine Index: 2.4 (ref 1.2–4.9)
T3 Uptake Ratio: 31 % (ref 24–39)
T4, Total: 7.6 ug/dL (ref 4.5–12.0)
TSH: 3.1 u[IU]/mL (ref 0.450–4.500)

## 2021-05-17 LAB — VITAMIN B12: Vitamin B-12: 256 pg/mL (ref 232–1245)

## 2021-05-20 ENCOUNTER — Telehealth: Payer: Self-pay | Admitting: *Deleted

## 2021-05-20 ENCOUNTER — Telehealth: Payer: Self-pay | Admitting: Neurology

## 2021-05-20 NOTE — Telephone Encounter (Signed)
LVM for pt to call to go over results.

## 2021-05-20 NOTE — Telephone Encounter (Signed)
[name] Josem Kaufmann: [id number] (exp. 05/20/21 to 11/16/21) order faxed to [facility], they will reach out to the patient to schedule  ?

## 2021-05-20 NOTE — Telephone Encounter (Signed)
-----   Message from Britt Bottom, MD sent at 05/17/2021  9:03 AM EDT ----- ?Please let the patient know that the lab work is normal.  However, the B12 was low normal and he should take supplements (1000 mcg OTC) ? ?

## 2021-05-22 NOTE — Telephone Encounter (Signed)
Made a second attempt to contact the pt. There was no answer on the home phone and no VM set up. ?Called the cell phone and was able to leave a detailed message with the results and recommendation. Instructed the pt to call back with any questions or concerns. ?

## 2021-05-23 ENCOUNTER — Encounter: Payer: Self-pay | Admitting: Neurology

## 2021-05-27 DIAGNOSIS — R413 Other amnesia: Secondary | ICD-10-CM | POA: Diagnosis not present

## 2021-05-28 DIAGNOSIS — M159 Polyosteoarthritis, unspecified: Secondary | ICD-10-CM | POA: Diagnosis not present

## 2021-05-28 DIAGNOSIS — N529 Male erectile dysfunction, unspecified: Secondary | ICD-10-CM | POA: Diagnosis not present

## 2021-05-28 DIAGNOSIS — K219 Gastro-esophageal reflux disease without esophagitis: Secondary | ICD-10-CM | POA: Diagnosis not present

## 2021-05-28 DIAGNOSIS — R69 Illness, unspecified: Secondary | ICD-10-CM | POA: Diagnosis not present

## 2021-05-28 DIAGNOSIS — I10 Essential (primary) hypertension: Secondary | ICD-10-CM | POA: Diagnosis not present

## 2021-05-28 DIAGNOSIS — E785 Hyperlipidemia, unspecified: Secondary | ICD-10-CM | POA: Diagnosis not present

## 2021-05-28 DIAGNOSIS — R197 Diarrhea, unspecified: Secondary | ICD-10-CM | POA: Diagnosis not present

## 2021-05-28 DIAGNOSIS — I2510 Atherosclerotic heart disease of native coronary artery without angina pectoris: Secondary | ICD-10-CM | POA: Diagnosis not present

## 2021-05-28 DIAGNOSIS — I252 Old myocardial infarction: Secondary | ICD-10-CM | POA: Diagnosis not present

## 2021-05-28 DIAGNOSIS — E291 Testicular hypofunction: Secondary | ICD-10-CM | POA: Diagnosis not present

## 2021-06-04 DIAGNOSIS — X32XXXA Exposure to sunlight, initial encounter: Secondary | ICD-10-CM | POA: Diagnosis not present

## 2021-06-04 DIAGNOSIS — D485 Neoplasm of uncertain behavior of skin: Secondary | ICD-10-CM | POA: Diagnosis not present

## 2021-06-04 DIAGNOSIS — L570 Actinic keratosis: Secondary | ICD-10-CM | POA: Diagnosis not present

## 2021-06-04 DIAGNOSIS — D2262 Melanocytic nevi of left upper limb, including shoulder: Secondary | ICD-10-CM | POA: Diagnosis not present

## 2021-06-04 DIAGNOSIS — D225 Melanocytic nevi of trunk: Secondary | ICD-10-CM | POA: Diagnosis not present

## 2021-06-04 DIAGNOSIS — D2261 Melanocytic nevi of right upper limb, including shoulder: Secondary | ICD-10-CM | POA: Diagnosis not present

## 2021-06-04 DIAGNOSIS — D2271 Melanocytic nevi of right lower limb, including hip: Secondary | ICD-10-CM | POA: Diagnosis not present

## 2021-06-04 DIAGNOSIS — D2272 Melanocytic nevi of left lower limb, including hip: Secondary | ICD-10-CM | POA: Diagnosis not present

## 2021-06-04 DIAGNOSIS — L821 Other seborrheic keratosis: Secondary | ICD-10-CM | POA: Diagnosis not present

## 2021-06-06 ENCOUNTER — Telehealth: Payer: Self-pay | Admitting: *Deleted

## 2021-06-06 NOTE — Telephone Encounter (Addendum)
Request made. R/c cd on 06/10/21 ?

## 2021-06-06 NOTE — Telephone Encounter (Signed)
Looks like pt may have had MRI completed via [facility]. We have not receive results yet. Can you follow up to get copy of report/CD for MD to review? Thank you ?

## 2021-06-06 NOTE — Telephone Encounter (Signed)
Pt call for MRI results. Please call [phone] ?

## 2021-06-13 ENCOUNTER — Telehealth: Payer: Self-pay | Admitting: *Deleted

## 2021-06-13 ENCOUNTER — Other Ambulatory Visit: Payer: Self-pay | Admitting: Neurology

## 2021-06-13 NOTE — Telephone Encounter (Signed)
Called the pt back. I was able to review the MRI results with him. Informed the patient of the findings. Pt states he has not started galantamine yet. He states he never knew it was called into the pharmacy. Advised that Dr Felecia Shelling had wrote for 4 mg twice a day with meals at the visit in march. Pt states pharmacy never made aware they had a script. Advised that he should call the pharmacy and see about getting this medication filled. Advised the patient that once the medication is started as long as he has tolerated it well, our goal would be to increase this to 8 mg twice a day. Advised the pt to call us and provide an update after being on the medication a month. Pt verbalized understanding. ?Pt had no questions at this time but was encouraged to call back if questions arise. ? ?

## 2021-06-13 NOTE — Telephone Encounter (Signed)
Pt returned the call, nobody was available.  ?

## 2021-06-13 NOTE — Telephone Encounter (Signed)
-----   Message from Britt Bottom, MD sent at 06/12/2021  6:20 PM EDT ----- ?Regarding: MRI result ?Please let him or his wife know that I took a look at the MRI.  It does show that there is atrophy in a pattern that could be consistent with Alzheimer's disease.  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  These do not always cause symptoms. ? ?I started galantamine at the last visit at a low dose (he has been unable to tolerate donepezil) if he is tolerating the galantamine well, I would like to double the dose to 8 mg twice a day ? ?

## 2021-06-13 NOTE — Telephone Encounter (Signed)
Tried calling [phone]. Got busy signal.  ? ?Called [phone]. LVM for pt to call office back about MRI results.  ?

## 2021-06-13 NOTE — Telephone Encounter (Addendum)
Pharmacy sent request asking for alternate to Galantamine. Cost too high for pt. I called pharmacy. Copay 100.00. No PA needed. Aware we will attempt tier exception and update them once we hear back.  ? ?Submitted tier exception on CMM. Key: [id number]. Waiting on determination from [facility] Part D. ? ?If denied, pt has option to fill using goodrx coupon instead or we can switch rx to memantine.  ?

## 2021-06-14 DIAGNOSIS — L905 Scar conditions and fibrosis of skin: Secondary | ICD-10-CM | POA: Diagnosis not present

## 2021-06-14 DIAGNOSIS — D225 Melanocytic nevi of trunk: Secondary | ICD-10-CM | POA: Diagnosis not present

## 2021-06-17 ENCOUNTER — Telehealth: Payer: Self-pay | Admitting: Neurology

## 2021-06-17 NOTE — Telephone Encounter (Signed)
Called pt and LVM to inform him tier exception approved. Now a tier 1. Should f/u with pharmacy to re-run test claim to see what new cost will be. Advised him to call back if he has any further questions. ?

## 2021-06-17 NOTE — Telephone Encounter (Signed)
Called and LVM returning wife call. ? ?Pt should take galantamine '4mg'$  tablet, 1 tablet by mouth twice daily with meals. ?

## 2021-06-17 NOTE — Telephone Encounter (Signed)
Received fax from [place] that tier exception approved 02/24/21-02/23/22.  ?

## 2021-06-17 NOTE — Telephone Encounter (Signed)
Pt's wife, Shalamar Crays (on [place]) would like a call from the nurse to discuss galantamine (RAZADYNE) 4 MG tablet and how to take it. He has never taken this medication. ?

## 2021-07-09 DIAGNOSIS — E785 Hyperlipidemia, unspecified: Secondary | ICD-10-CM | POA: Diagnosis not present

## 2021-07-09 DIAGNOSIS — E291 Testicular hypofunction: Secondary | ICD-10-CM | POA: Diagnosis not present

## 2021-07-09 DIAGNOSIS — E669 Obesity, unspecified: Secondary | ICD-10-CM | POA: Diagnosis not present

## 2021-07-09 DIAGNOSIS — I252 Old myocardial infarction: Secondary | ICD-10-CM | POA: Diagnosis not present

## 2021-07-09 DIAGNOSIS — N1831 Chronic kidney disease, stage 3a: Secondary | ICD-10-CM | POA: Diagnosis not present

## 2021-07-09 DIAGNOSIS — I2510 Atherosclerotic heart disease of native coronary artery without angina pectoris: Secondary | ICD-10-CM | POA: Diagnosis not present

## 2021-07-09 DIAGNOSIS — N529 Male erectile dysfunction, unspecified: Secondary | ICD-10-CM | POA: Diagnosis not present

## 2021-07-09 DIAGNOSIS — K219 Gastro-esophageal reflux disease without esophagitis: Secondary | ICD-10-CM | POA: Diagnosis not present

## 2021-07-09 DIAGNOSIS — Z6830 Body mass index (BMI) 30.0-30.9, adult: Secondary | ICD-10-CM | POA: Diagnosis not present

## 2021-07-09 DIAGNOSIS — R69 Illness, unspecified: Secondary | ICD-10-CM | POA: Diagnosis not present

## 2021-07-09 DIAGNOSIS — I10 Essential (primary) hypertension: Secondary | ICD-10-CM | POA: Diagnosis not present

## 2021-07-09 DIAGNOSIS — M159 Polyosteoarthritis, unspecified: Secondary | ICD-10-CM | POA: Diagnosis not present

## 2021-09-11 DIAGNOSIS — Z6830 Body mass index (BMI) 30.0-30.9, adult: Secondary | ICD-10-CM | POA: Diagnosis not present

## 2021-09-11 DIAGNOSIS — N1831 Chronic kidney disease, stage 3a: Secondary | ICD-10-CM | POA: Diagnosis not present

## 2021-09-11 DIAGNOSIS — I10 Essential (primary) hypertension: Secondary | ICD-10-CM | POA: Diagnosis not present

## 2021-09-11 DIAGNOSIS — M159 Polyosteoarthritis, unspecified: Secondary | ICD-10-CM | POA: Diagnosis not present

## 2021-09-11 DIAGNOSIS — R69 Illness, unspecified: Secondary | ICD-10-CM | POA: Diagnosis not present

## 2021-09-11 DIAGNOSIS — I252 Old myocardial infarction: Secondary | ICD-10-CM | POA: Diagnosis not present

## 2021-09-11 DIAGNOSIS — I2510 Atherosclerotic heart disease of native coronary artery without angina pectoris: Secondary | ICD-10-CM | POA: Diagnosis not present

## 2021-09-11 DIAGNOSIS — N529 Male erectile dysfunction, unspecified: Secondary | ICD-10-CM | POA: Diagnosis not present

## 2021-09-11 DIAGNOSIS — K219 Gastro-esophageal reflux disease without esophagitis: Secondary | ICD-10-CM | POA: Diagnosis not present

## 2021-09-11 DIAGNOSIS — E291 Testicular hypofunction: Secondary | ICD-10-CM | POA: Diagnosis not present

## 2021-09-11 DIAGNOSIS — E785 Hyperlipidemia, unspecified: Secondary | ICD-10-CM | POA: Diagnosis not present

## 2021-09-11 DIAGNOSIS — E669 Obesity, unspecified: Secondary | ICD-10-CM | POA: Diagnosis not present

## 2021-10-04 DIAGNOSIS — H25813 Combined forms of age-related cataract, bilateral: Secondary | ICD-10-CM | POA: Diagnosis not present

## 2021-10-04 DIAGNOSIS — H35369 Drusen (degenerative) of macula, unspecified eye: Secondary | ICD-10-CM | POA: Diagnosis not present

## 2021-10-04 DIAGNOSIS — H353131 Nonexudative age-related macular degeneration, bilateral, early dry stage: Secondary | ICD-10-CM | POA: Diagnosis not present

## 2021-10-08 ENCOUNTER — Encounter: Payer: Self-pay | Admitting: Cardiology

## 2021-10-16 DIAGNOSIS — I10 Essential (primary) hypertension: Secondary | ICD-10-CM | POA: Diagnosis not present

## 2021-10-16 DIAGNOSIS — N529 Male erectile dysfunction, unspecified: Secondary | ICD-10-CM | POA: Diagnosis not present

## 2021-10-16 DIAGNOSIS — K219 Gastro-esophageal reflux disease without esophagitis: Secondary | ICD-10-CM | POA: Diagnosis not present

## 2021-10-16 DIAGNOSIS — N1831 Chronic kidney disease, stage 3a: Secondary | ICD-10-CM | POA: Diagnosis not present

## 2021-10-16 DIAGNOSIS — R5382 Chronic fatigue, unspecified: Secondary | ICD-10-CM | POA: Diagnosis not present

## 2021-10-16 DIAGNOSIS — Z6830 Body mass index (BMI) 30.0-30.9, adult: Secondary | ICD-10-CM | POA: Diagnosis not present

## 2021-10-16 DIAGNOSIS — I252 Old myocardial infarction: Secondary | ICD-10-CM | POA: Diagnosis not present

## 2021-10-16 DIAGNOSIS — E291 Testicular hypofunction: Secondary | ICD-10-CM | POA: Diagnosis not present

## 2021-10-16 DIAGNOSIS — M159 Polyosteoarthritis, unspecified: Secondary | ICD-10-CM | POA: Diagnosis not present

## 2021-10-16 DIAGNOSIS — E785 Hyperlipidemia, unspecified: Secondary | ICD-10-CM | POA: Diagnosis not present

## 2021-10-16 DIAGNOSIS — I2510 Atherosclerotic heart disease of native coronary artery without angina pectoris: Secondary | ICD-10-CM | POA: Diagnosis not present

## 2021-10-16 DIAGNOSIS — R69 Illness, unspecified: Secondary | ICD-10-CM | POA: Diagnosis not present

## 2021-10-24 ENCOUNTER — Encounter: Payer: Self-pay | Admitting: Neurology

## 2021-10-24 ENCOUNTER — Ambulatory Visit: Payer: Medicare HMO | Admitting: Neurology

## 2021-10-24 VITALS — BP 125/69 | HR 67 | Ht 71.0 in | Wt 214.8 lb

## 2021-10-24 DIAGNOSIS — G309 Alzheimer's disease, unspecified: Secondary | ICD-10-CM

## 2021-10-24 DIAGNOSIS — R69 Illness, unspecified: Secondary | ICD-10-CM | POA: Diagnosis not present

## 2021-10-24 DIAGNOSIS — F0280 Dementia in other diseases classified elsewhere without behavioral disturbance: Secondary | ICD-10-CM

## 2021-10-24 DIAGNOSIS — R413 Other amnesia: Secondary | ICD-10-CM | POA: Diagnosis not present

## 2021-10-24 MED ORDER — GALANTAMINE HYDROBROMIDE 8 MG PO TABS
8.0000 mg | ORAL_TABLET | Freq: Two times a day (BID) | ORAL | 11 refills | Status: DC
Start: 2021-10-24 — End: 2022-10-15

## 2021-10-24 NOTE — Progress Notes (Signed)
[facility]  PATIENT: Sean Marks DOB: 1946/01/25  REFERRING DOCTOR OR PCP: Shirlee More, MD; Cher Nakai MD (PCP) SOURCE: Patient, notes from other doctors, lab results, cognitive testing.  _________________________________   HISTORICAL  CHIEF COMPLAINT:  Chief Complaint  Patient presents with   Follow-up    RM 1 with wife. Last seen 05/16/21. Last MOCA 20/30. Today's MOCA 18/30. Wife states his memory has worsened since last visit.     HISTORY OF PRESENT ILLNESS:  Sean Marks is a 76 y.o. man with progressive cognitive decline since 2017.  Update 10/24/2021 He is a pleasant 76 y.o. man  who first noted mild memory issues about 2017.  Both he and his wife note he has gradually progressed.  As examples, he has difficulties with driving directions more recently, directions with driving are more difficult but he has not been lost.    He has no difficulties with most activities of daily living.  The memory and other cognitive decline has not been associated with any physical change.   He has not had any imaging of his head.     He took donepezil but had nightmares on donepezil and stopped.   At the last visit, we started galantamine 4 mg p.o. twice daily.  He tolerates it well.  We discussed increasing the dose.  We also spent some time discussing lecanemab and clinical studies.  He is fairly active.  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  He spends some of his day doing chores.  He is otherwise in good health.   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     Both of his parents were cognitively sharp until they dies of old age.  His brothers and sisters have no cognitive  Two uncles had Alzheimer's disease.         10/24/2021    8:15 AM 05/16/2021   10:28 AM  Montreal Cognitive Assessment   Visuospatial/ Executive (0/5) 4 3  Naming (0/3) 2 3  Attention: Read list of digits  (0/2) 2 2  Attention: Read list of letters (0/1) 1 1  Attention: Serial 7 subtraction starting at 100 (0/3) 1 1  Language: Repeat phrase (0/2) 1 2  Language : Fluency (0/1) 0 0  Abstraction (0/2) 1 2  Delayed Recall (0/5) 0 0  Orientation (0/6) 5 6  Total 17 20  Adjusted Score (based on education) 18      REVIEW OF SYSTEMS: Constitutional: No fevers, chills, sweats, or change in appetite Eyes: No visual changes, double vision, eye pain Ear, nose and throat: No hearing loss, ear pain, nasal congestion, sore throat Cardiovascular: No chest pain, palpitations Respiratory:  No shortness of breath at rest or with exertion.   No wheezes GastrointestinaI: No nausea, vomiting, diarrhea, abdominal pain, fecal incontinence Genitourinary:  No dysuria, urinary retention or frequency.  No nocturia. Musculoskeletal:  No neck pain, back pain Integumentary: No rash, pruritus, skin lesions Neurological: as above Psychiatric: No depression at this time.  No anxiety Endocrine: No palpitations, diaphoresis, change in appetite, change in weigh or increased thirst Hematologic/Lymphatic:  No anemia, purpura, petechiae. Allergic/Immunologic: No itchy/runny eyes, nasal congestion, recent allergic reactions, rashes  ALLERGIES: Allergies  Allergen Reactions   Oxycodone Nausea Only    HOME MEDICATIONS:  Current Outpatient Medications:    aspirin EC 81 MG tablet, Take 1 tablet (81 mg total) by  mouth every other day., Disp: 90 tablet, Rfl: 2   galantamine (RAZADYNE) 8 MG tablet, Take 1 tablet (8 mg total) by mouth 2 (two) times daily., Disp: 60 tablet, Rfl: 11   Omega-3 Fatty Acids (FISH OIL) 1000 MG CAPS, Take 1 capsule (1,000 mg total) by mouth 2 (two) times daily. (Patient taking differently: Take 1,000 mg by mouth in the morning and at bedtime.), Disp: 180 capsule, Rfl: 1   omeprazole (PRILOSEC) 40 MG capsule, Take 40 mg by mouth every other day., Disp: , Rfl:    ramipril (ALTACE) 5 MG capsule, Take  1 capsule (5 mg total) by mouth every evening. (Patient taking differently: Take 5 mg by mouth every other day.), Disp: 90 capsule, Rfl: 1   simvastatin (ZOCOR) 40 MG tablet, TAKE 1 TABLET BY MOUTH EVERYDAY AT BEDTIME (Patient taking differently: Take 40 mg by mouth every other day.), Disp: 90 tablet, Rfl: 2   nitroGLYCERIN (NITROSTAT) 0.4 MG SL tablet, Place 1 tablet (0.4 mg total) under the tongue every 5 (five) minutes as needed., Disp: 30 tablet, Rfl: 3  Current Facility-Administered Medications:    methylPREDNISolone acetate (DEPO-MEDROL) injection 80 mg, 80 mg, Intramuscular, Once, Lanae Crumbly, PA-C  PAST MEDICAL HISTORY: Past Medical History:  Diagnosis Date   Asthma    Coronary artery disease    Hyperlipidemia    Hypertension     PAST SURGICAL HISTORY: Past Surgical History:  Procedure Laterality Date   BACK SURGERY     KNEE SURGERY      FAMILY HISTORY: Family History  Problem Relation Age of Onset   Heart disease Father    Heart disease Brother    Heart disease Brother     SOCIAL HISTORY:  Social History   Socioeconomic History   Marital status: Married    Spouse name: Mae   Number of children: 1   Years of education: Not on file   Highest education level: High school graduate  Occupational History   Not on file  Tobacco Use   Smoking status: Former    Passive exposure: Never   Smokeless tobacco: Never  Scientific laboratory technician Use: Never used  Substance and Sexual Activity   Alcohol use: Never   Drug use: Never   Sexual activity: Not on file  Other Topics Concern   Not on file  Social History Narrative   Lives at home w wife   Right handed   Caffeine: 1 C of coffee and mountain dew a day. Tea sometimes   Social Determinants of Radio broadcast assistant Strain: Not on file  Food Insecurity: Not on file  Transportation Needs: Not on file  Physical Activity: Not on file  Stress: Not on file  Social Connections: Not on file  Intimate Partner  Violence: Not on file     PHYSICAL EXAM  Vitals:   10/24/21 0755  BP: 125/69  Pulse: 67  Weight: 214 lb 12.8 oz (97.4 kg)  Height: '5\' 11"'$  (1.803 m)    Body mass index is 29.96 kg/m.   General: The patient is well-developed and well-nourished and in no acute distress  HEENT:  Head is [place]/AT.  Sclera are anicteric.    Neck: No carotid bruits are noted.  The neck is nontender.  Cardiovascular: The heart has a regular rate and rhythm with a normal S1 and S2. There were no murmurs, gallops or rubs.    Skin: Extremities are without rash or  edema.  Musculoskeletal:  Back  is nontender  Neurologic Exam  Mental status: The patient is alert and oriented x 2 1/2 at the time of the examination. The patient has reduced short-term memory.  His clock was drawn correctly.  He scored 18/30 on the [facility] cognitive assessment.   Speech is normal.  Cranial nerves: Extraocular movements are full.  Facial strength and sensation was normal.   The tongue is midline, and the patient has symmetric elevation of the soft palate. No obvious hearing deficits are noted.  Motor:  Muscle bulk is normal.   Tone is normal. Strength is  5 / 5 in all 4 extremities.   Sensory: Sensory testing is intact to pinprick, soft touch and vibration sensation in all 4 extremities.  Coordination: Cerebellar testing reveals good finger-nose-finger and heel-to-shin bilaterally.  Gait and station: Station is normal.   Gait is normal. Tandem gait is mildly wide.. Romberg is negative.   Reflexes: Deep tendon reflexes are symmetric and normal bilaterally.   Plantar responses are flexor.    DIAGNOSTIC DATA (LABS, IMAGING, TESTING) - I reviewed patient records, labs, notes, testing and imaging myself where available.  Lab Results  Component Value Date   WBC 13.0 (H) 07/31/2009   HGB 14.4 07/31/2009   HCT 41.7 07/31/2009   MCV 91.1 07/31/2009   PLT 182 07/31/2009      Component Value Date/Time   NA 138  08/06/2020 0940   K 4.8 08/06/2020 0940   CL 101 08/06/2020 0940   CO2 24 08/06/2020 0940   GLUCOSE 103 (H) 08/06/2020 0940   GLUCOSE 86 05/05/2019 1144   BUN 9 08/06/2020 0940   CREATININE 1.09 08/06/2020 0940   CREATININE 1.28 (H) 05/05/2019 1144   CALCIUM 9.5 08/06/2020 0940   PROT 7.0 08/06/2020 0940   ALBUMIN 4.4 08/06/2020 0940   AST 20 08/06/2020 0940   ALT 17 08/06/2020 0940   ALKPHOS 102 08/06/2020 0940   BILITOT 0.3 08/06/2020 0940   GFRNONAA 56 (L) 05/05/2019 1144   GFRAA 64 05/05/2019 1144   Lab Results  Component Value Date   CHOL 181 08/06/2020   HDL 43 08/06/2020   LDLCALC 92 08/06/2020   TRIG 272 (H) 08/06/2020   CHOLHDL 4.2 08/06/2020      ASSESSMENT AND PLAN  Memory loss - Plan: MR BRAIN WO CONTRAST  Alzheimer disease ([place]) - Plan: MR BRAIN WO CONTRAST   I believe he most likely has Alzheimer's disease.  We need to check MRI of the brain without contrast to determine if there are changes consistent with vascular dementia or other structural problem.  This will also allow [place] to assess the atrophy pattern.   Increase galantamine to 8 mg p.o. twice daily.  We also discussed lecanemab, both the pros and cons.  He would like to hold off at this point. RTC 6 months or sooner if there are new or worsening neurologic symptoms.    Kristyn Obyrne A. Felecia Shelling, MD, [facility] 2/67/1245, 80:99 PM Certified in Neurology, Clinical Neurophysiology, Sleep Medicine and Neuroimaging  [facility] [address], [place], [place] [address] [phone]--

## 2021-11-05 ENCOUNTER — Telehealth: Payer: Self-pay | Admitting: Neurology

## 2021-11-05 NOTE — Telephone Encounter (Signed)
[name] Sean Marks: [id number] exp. 11/04/21-05/03/22 sent to [facility]

## 2021-11-20 ENCOUNTER — Telehealth: Payer: Self-pay | Admitting: Neurology

## 2021-11-20 NOTE — Telephone Encounter (Signed)
Pt wife is calling. Stated Pt is supposed to be having a MRI. She said no one has reached out to her. Requesting a follow up call-back .

## 2021-11-20 NOTE — Telephone Encounter (Signed)
LVM for patient letting him know his MRI was sent to [facility] [phone]

## 2021-11-27 DIAGNOSIS — G309 Alzheimer's disease, unspecified: Secondary | ICD-10-CM | POA: Diagnosis not present

## 2021-11-27 DIAGNOSIS — R413 Other amnesia: Secondary | ICD-10-CM | POA: Diagnosis not present

## 2021-12-02 ENCOUNTER — Telehealth: Payer: Self-pay | Admitting: Neurology

## 2021-12-02 NOTE — Telephone Encounter (Signed)
  Received the impression report for the patient completing MRI of brain. Dr Felecia Shelling is out of the office and will have Dr Dohmeier review and result for the patient.

## 2021-12-03 NOTE — Telephone Encounter (Signed)
Per [facility] CMA spoke to patient Called pt and informed him about his MRI results, pt had some concerns about his memory. Pt has sch an f/u appt 03/28/2022. Pt verbalized understanding. Pt had no questions at this time but was encouraged to call back if questions arise.

## 2021-12-11 ENCOUNTER — Ambulatory Visit: Payer: Medicare HMO | Attending: Cardiology | Admitting: Cardiology

## 2021-12-11 ENCOUNTER — Encounter: Payer: Self-pay | Admitting: Cardiology

## 2021-12-11 VITALS — BP 100/60 | HR 62 | Ht 71.0 in | Wt 231.0 lb

## 2021-12-11 DIAGNOSIS — I10 Essential (primary) hypertension: Secondary | ICD-10-CM | POA: Diagnosis not present

## 2021-12-11 DIAGNOSIS — E782 Mixed hyperlipidemia: Secondary | ICD-10-CM

## 2021-12-11 DIAGNOSIS — I2510 Atherosclerotic heart disease of native coronary artery without angina pectoris: Secondary | ICD-10-CM

## 2021-12-11 NOTE — Patient Instructions (Signed)
Medication Instructions:  Your physician recommends that you continue on your current medications as directed. Please refer to the Current Medication list given to you today.  *If you need a refill on your cardiac medications before your next appointment, please call your pharmacy*   Lab Work: NONE If you have labs (blood work) drawn today and your tests are completely normal, you will receive your results only by: [facility] (if you have MyChart) OR A paper copy in the mail If you have any lab test that is abnormal or we need to change your treatment, we will call you to review the results.   Testing/Procedures: NONE   Follow-Up: At [facility], you and your health needs are our priority.  As part of our continuing mission to provide you with exceptional heart care, we have created designated Provider Care Teams.  These Care Teams include your primary Cardiologist (physician) and Advanced Practice Providers (APPs -  Physician Assistants and Nurse Practitioners) who all work together to provide you with the care you need, when you need it.  We recommend signing up for the patient portal called "MyChart".  Sign up information is provided on this After Visit Summary.  MyChart is used to connect with patients for Virtual Visits (Telemedicine).  Patients are able to view lab/test results, encounter notes, upcoming appointments, etc.  Non-urgent messages can be sent to your provider as well.   To learn more about what you can do with MyChart, go to [email or web address].    Your next appointment:   9 month(s)  The format for your next appointment:   In Person  Provider:   Shirlee More, MD    Other Instructions Purchase Digital BP Device (Omron) Check and record BP daily  Important Information About Sugar

## 2021-12-11 NOTE — Progress Notes (Signed)
Cardiology Office Note:    Date:  12/11/2021   ID:  Sean Marks, DOB 1945-12-16, MRN [id number]  PCP:  Cher Nakai, MD  Cardiologist:  Shirlee More, MD    Referring MD: Cher Nakai, MD    ASSESSMENT:    1. CAD in native artery   2. Essential hypertension   3. Mixed hyperlipidemia    PLAN:    In order of problems listed above:  Fortunately has stable chronic CAD remote PCI and is having no angina on current medical treatment including aspirin ACE inhibitor statin and over-the-counter fish oil.  At this time I would not pursue an ischemic evaluation Stable I am always unsure the meaning of a single office BP and asked him to purchase a validated device good technique record and trend blood pressures if he is consistently in the range of 100 or less had stop his ACE inhibitor Continue with statin well-tolerated LDL is at target and labs are followed in his PCP office   Next appointment: 9 months   Medication Adjustments/Labs and Tests Ordered: Current medicines are reviewed at length with the patient today.  Concerns regarding medicines are outlined above.  No orders of the defined types were placed in this encounter.  No orders of the defined types were placed in this encounter.   Chief Complaint  Patient presents with   Follow-up  For CAD  History of Present Illness:    Sean Marks is a 76 y.o. male with a hx of CAD with previous remote PCI of circumflex coronary artery hypertension and hyperlipidemia  last seen 03/04/2020. He had a low risk myocardial perfusion study 01/26/2020 EF 56% normal left ventricular function no ischemia  Compliance with diet, lifestyle and medications: Yes  He is very unsettled he has been seen by neurology started on Aricept and does not think that it is helped him He has had no cardiovascular symptoms of exercise intolerance chest pain edema shortness of breath palpitation or syncope He has labs in his PCP office in July creatinine 1.26  potassium 4.8 and last lipid profile 07/09/2021 cholesterol 171 LDL 101 He has had some episodes of lightheadedness his blood pressure today is relatively low I asked him to purchase a validated device with good technique and to record his blood pressure daily. He is taking an ACE inhibitor Past Medical History:  Diagnosis Date   Asthma    Coronary artery disease    Hyperlipidemia    Hypertension     Past Surgical History:  Procedure Laterality Date   BACK SURGERY     KNEE SURGERY      Current Medications: Current Meds  Medication Sig   aspirin EC 81 MG tablet Take 1 tablet (81 mg total) by mouth every other day.   galantamine (RAZADYNE) 8 MG tablet Take 1 tablet (8 mg total) by mouth 2 (two) times daily.   nitroGLYCERIN (NITROSTAT) 0.4 MG SL tablet Place 1 tablet (0.4 mg total) under the tongue every 5 (five) minutes as needed.   Omega-3 Fatty Acids (FISH OIL) 1000 MG CAPS Take 1 capsule (1,000 mg total) by mouth 2 (two) times daily.   omeprazole (PRILOSEC) 40 MG capsule Take 40 mg by mouth every other day.   ramipril (ALTACE) 5 MG capsule Take 1 capsule (5 mg total) by mouth every evening.   simvastatin (ZOCOR) 40 MG tablet TAKE 1 TABLET BY MOUTH EVERYDAY AT BEDTIME   Current Facility-Administered Medications for the 12/11/21 encounter (Office Visit) with Bettina Gavia,  Hilton Cork, MD  Medication   methylPREDNISolone acetate (DEPO-MEDROL) injection 80 mg     Allergies:   Oxycodone and Donepezil   Social History   Socioeconomic History   Marital status: Married    Spouse name: Mae   Number of children: 1   Years of education: Not on file   Highest education level: High school graduate  Occupational History   Not on file  Tobacco Use   Smoking status: Former    Passive exposure: Never   Smokeless tobacco: Never  Vaping Use   Vaping Use: Never used  Substance and Sexual Activity   Alcohol use: Never   Drug use: Never   Sexual activity: Not on file  Other Topics Concern    Not on file  Social History Narrative   Lives at home w wife   Right handed   Caffeine: 1 C of coffee and mountain dew a day. Tea sometimes   Social Determinants of Radio broadcast assistant Strain: Not on file  Food Insecurity: Not on file  Transportation Needs: Not on file  Physical Activity: Not on file  Stress: Not on file  Social Connections: Not on file     Family History: The patient's family history includes Heart disease in his brother, brother, and father. ROS:   Please see the history of present illness.    All other systems reviewed and are negative.  EKGs/Labs/Other Studies Reviewed:    The following studies were reviewed today:   Recent Labs: 05/16/2021: TSH 3.100  Recent Lipid Panel    Component Value Date/Time   CHOL 181 08/06/2020 0940   TRIG 272 (H) 08/06/2020 0940   HDL 43 08/06/2020 0940   CHOLHDL 4.2 08/06/2020 0940   LDLCALC 92 08/06/2020 0940    Physical Exam:    VS:  BP 100/60 (BP Location: Left Arm, Patient Position: Sitting, Cuff Size: Normal)   Pulse 62   Ht '5\' 11"'$  (1.803 m)   Wt 231 lb (104.8 kg)   SpO2 96%   BMI 32.22 kg/m     Wt Readings from Last 3 Encounters:  12/11/21 231 lb (104.8 kg)  10/24/21 214 lb 12.8 oz (97.4 kg)  05/16/21 214 lb (97.1 kg)     GEN: He seems quite apathetic well nourished, well developed in no acute distress HEENT: Normal NECK: No JVD; No carotid bruits LYMPHATICS: No lymphadenopathy CARDIAC: RRR, no murmurs, rubs, gallops RESPIRATORY:  Clear to auscultation without rales, wheezing or rhonchi  ABDOMEN: Soft, non-tender, non-distended MUSCULOSKELETAL:  No edema; No deformity  SKIN: Warm and dry NEUROLOGIC:  Alert and oriented x 3 PSYCHIATRIC:  Normal affect    Signed, Shirlee More, MD  12/11/2021 9:03 AM    [place]

## 2021-12-16 DIAGNOSIS — K219 Gastro-esophageal reflux disease without esophagitis: Secondary | ICD-10-CM | POA: Diagnosis not present

## 2021-12-16 DIAGNOSIS — I252 Old myocardial infarction: Secondary | ICD-10-CM | POA: Diagnosis not present

## 2021-12-16 DIAGNOSIS — Z6830 Body mass index (BMI) 30.0-30.9, adult: Secondary | ICD-10-CM | POA: Diagnosis not present

## 2021-12-16 DIAGNOSIS — E291 Testicular hypofunction: Secondary | ICD-10-CM | POA: Diagnosis not present

## 2021-12-16 DIAGNOSIS — I10 Essential (primary) hypertension: Secondary | ICD-10-CM | POA: Diagnosis not present

## 2021-12-16 DIAGNOSIS — I2510 Atherosclerotic heart disease of native coronary artery without angina pectoris: Secondary | ICD-10-CM | POA: Diagnosis not present

## 2021-12-16 DIAGNOSIS — N1831 Chronic kidney disease, stage 3a: Secondary | ICD-10-CM | POA: Diagnosis not present

## 2021-12-16 DIAGNOSIS — N529 Male erectile dysfunction, unspecified: Secondary | ICD-10-CM | POA: Diagnosis not present

## 2021-12-16 DIAGNOSIS — R69 Illness, unspecified: Secondary | ICD-10-CM | POA: Diagnosis not present

## 2021-12-16 DIAGNOSIS — E785 Hyperlipidemia, unspecified: Secondary | ICD-10-CM | POA: Diagnosis not present

## 2021-12-16 DIAGNOSIS — Z23 Encounter for immunization: Secondary | ICD-10-CM | POA: Diagnosis not present

## 2021-12-16 DIAGNOSIS — M159 Polyosteoarthritis, unspecified: Secondary | ICD-10-CM | POA: Diagnosis not present

## 2021-12-30 DIAGNOSIS — Z1212 Encounter for screening for malignant neoplasm of rectum: Secondary | ICD-10-CM | POA: Diagnosis not present

## 2021-12-30 DIAGNOSIS — K219 Gastro-esophageal reflux disease without esophagitis: Secondary | ICD-10-CM | POA: Diagnosis not present

## 2021-12-30 DIAGNOSIS — I252 Old myocardial infarction: Secondary | ICD-10-CM | POA: Diagnosis not present

## 2021-12-30 DIAGNOSIS — E291 Testicular hypofunction: Secondary | ICD-10-CM | POA: Diagnosis not present

## 2021-12-30 DIAGNOSIS — Z125 Encounter for screening for malignant neoplasm of prostate: Secondary | ICD-10-CM | POA: Diagnosis not present

## 2021-12-30 DIAGNOSIS — M159 Polyosteoarthritis, unspecified: Secondary | ICD-10-CM | POA: Diagnosis not present

## 2021-12-30 DIAGNOSIS — Z0000 Encounter for general adult medical examination without abnormal findings: Secondary | ICD-10-CM | POA: Diagnosis not present

## 2021-12-30 DIAGNOSIS — N1831 Chronic kidney disease, stage 3a: Secondary | ICD-10-CM | POA: Diagnosis not present

## 2021-12-30 DIAGNOSIS — I2510 Atherosclerotic heart disease of native coronary artery without angina pectoris: Secondary | ICD-10-CM | POA: Diagnosis not present

## 2021-12-30 DIAGNOSIS — I10 Essential (primary) hypertension: Secondary | ICD-10-CM | POA: Diagnosis not present

## 2021-12-30 DIAGNOSIS — N529 Male erectile dysfunction, unspecified: Secondary | ICD-10-CM | POA: Diagnosis not present

## 2021-12-30 DIAGNOSIS — R69 Illness, unspecified: Secondary | ICD-10-CM | POA: Diagnosis not present

## 2021-12-30 DIAGNOSIS — E785 Hyperlipidemia, unspecified: Secondary | ICD-10-CM | POA: Diagnosis not present

## 2022-01-17 DIAGNOSIS — H5203 Hypermetropia, bilateral: Secondary | ICD-10-CM | POA: Diagnosis not present

## 2022-01-17 DIAGNOSIS — H524 Presbyopia: Secondary | ICD-10-CM | POA: Diagnosis not present

## 2022-01-17 DIAGNOSIS — H52209 Unspecified astigmatism, unspecified eye: Secondary | ICD-10-CM | POA: Diagnosis not present

## 2022-04-22 ENCOUNTER — Encounter: Payer: Self-pay | Admitting: Neurology

## 2022-04-22 ENCOUNTER — Ambulatory Visit: Payer: Medicare HMO | Admitting: Neurology

## 2022-04-22 VITALS — BP 111/64 | HR 72 | Ht 70.0 in | Wt 214.5 lb

## 2022-04-22 DIAGNOSIS — G309 Alzheimer's disease, unspecified: Secondary | ICD-10-CM

## 2022-04-22 DIAGNOSIS — F0280 Dementia in other diseases classified elsewhere without behavioral disturbance: Secondary | ICD-10-CM

## 2022-04-22 DIAGNOSIS — R413 Other amnesia: Secondary | ICD-10-CM | POA: Diagnosis not present

## 2022-04-22 NOTE — Progress Notes (Signed)
[facility]  PATIENT: Sean Marks DOB: 11/27/1945  REFERRING DOCTOR OR PCP: Shirlee More, MD; Cher Nakai MD (PCP) SOURCE: Patient, notes from other doctors, lab results, cognitive testing.  _________________________________   HISTORICAL  CHIEF COMPLAINT:  Chief Complaint  Patient presents with   Room 10    Pt is here with his Wife and Son. Pt states that he started losing his memory 1 year ago. Pt's wife states that both his short term and long term memory is decreasing.     HISTORY OF PRESENT ILLNESS:  Sean Marks is a 77 y.o. man with progressive cognitive decline since 2017.  Update 04/22/2022 He has noted mild memory issues about 2017.  Both he and his wife note he has gradually progressed but generally the same as last visit 6 months ago.   As examples, he has difficulties with driving directions more recently, directions with driving are more difficult but he has not been lost.    He has no difficulties with most activities of daily living.  The memory and other cognitive decline has not been associated with any physical change.   He has not had any imaging of his head.     He took donepezil but had nightmares on donepezil and stopped.   At the last visit, we started galantamine 4 mg p.o. twice daily.  He tolerates it well.  We discussed increasing the dose.  We also spent some time discussing lecanemab and clinical studies.  He is fairly active.  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  He spends some of his day doing chores.  He is otherwise in good health.   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     Both of his parents were cognitively sharp until they dies of old age.  His brothers and sisters have no cognitive  Two uncles had Alzheimer's disease.         04/22/2022    8:09 AM 10/24/2021    8:15 AM 05/16/2021   10:28 AM  Montreal Cognitive Assessment    Visuospatial/ Executive (0/5) '3 4 3  '$ Naming (0/3) '1 2 3  '$ Attention: Read list of digits (0/2) '2 2 2  '$ Attention: Read list of letters (0/1) '1 1 1  '$ Attention: Serial 7 subtraction starting at 100 (0/3) '3 1 1  '$ Language: Repeat phrase (0/2) '2 1 2  '$ Language : Fluency (0/1) 1 0 0  Abstraction (0/2) '2 1 2  '$ Delayed Recall (0/5) 1 0 0  Orientation (0/6) '5 5 6  '$ Total '21 17 20  '$ Adjusted Score (based on education) 22 18    IMAGING: MRI of the brain 11/27/2021 shows mild to moderate generalized cortical atrophy, a little more involving the medial temporal lobes.  Third ventricle is enlarged.  There is also mild chronic microvascular ischemic change.  The MRI is unchanged compared to the 05/27/2021 MRI.  REVIEW OF SYSTEMS: Constitutional: No fevers, chills, sweats, or change in appetite Eyes: No visual changes, double vision, eye pain Ear, nose and throat: No hearing loss, ear pain, nasal congestion, sore throat Cardiovascular: No chest pain, palpitations Respiratory:  No shortness of breath at rest or with exertion.   No wheezes GastrointestinaI: No nausea, vomiting, diarrhea, abdominal pain, fecal incontinence Genitourinary:  No dysuria, urinary retention or frequency.  No nocturia. Musculoskeletal:  No neck pain, back pain Integumentary: No rash, pruritus, skin  lesions Neurological: as above Psychiatric: No depression at this time.  No anxiety Endocrine: No palpitations, diaphoresis, change in appetite, change in weigh or increased thirst Hematologic/Lymphatic:  No anemia, purpura, petechiae. Allergic/Immunologic: No itchy/runny eyes, nasal congestion, recent allergic reactions, rashes  ALLERGIES: Allergies  Allergen Reactions   Oxycodone Nausea Only   Donepezil Other (See Comments)    Nightmares    HOME MEDICATIONS:  Current Outpatient Medications:    aspirin EC 81 MG tablet, Take 1 tablet (81 mg total) by mouth every other day., Disp: 90 tablet, Rfl: 2   galantamine (RAZADYNE) 8 MG  tablet, Take 1 tablet (8 mg total) by mouth 2 (two) times daily., Disp: 60 tablet, Rfl: 11   Omega-3 Fatty Acids (FISH OIL) 1000 MG CAPS, Take 1 capsule (1,000 mg total) by mouth 2 (two) times daily., Disp: 180 capsule, Rfl: 1   omeprazole (PRILOSEC) 40 MG capsule, Take 40 mg by mouth every other day., Disp: , Rfl:    ramipril (ALTACE) 5 MG capsule, Take 1 capsule (5 mg total) by mouth every evening., Disp: 90 capsule, Rfl: 1   simvastatin (ZOCOR) 40 MG tablet, TAKE 1 TABLET BY MOUTH EVERYDAY AT BEDTIME, Disp: 90 tablet, Rfl: 2   nitroGLYCERIN (NITROSTAT) 0.4 MG SL tablet, Place 1 tablet (0.4 mg total) under the tongue every 5 (five) minutes as needed., Disp: 30 tablet, Rfl: 3  Current Facility-Administered Medications:    methylPREDNISolone acetate (DEPO-MEDROL) injection 80 mg, 80 mg, Intramuscular, Once, Lanae Crumbly, PA-C  PAST MEDICAL HISTORY: Past Medical History:  Diagnosis Date   Asthma    Coronary artery disease    Hyperlipidemia    Hypertension     PAST SURGICAL HISTORY: Past Surgical History:  Procedure Laterality Date   BACK SURGERY     KNEE SURGERY      FAMILY HISTORY: Family History  Problem Relation Age of Onset   Heart disease Father    Heart disease Brother    Heart disease Brother     SOCIAL HISTORY:  Social History   Socioeconomic History   Marital status: Married    Spouse name: Mae   Number of children: 1   Years of education: Not on file   Highest education level: High school graduate  Occupational History   Not on file  Tobacco Use   Smoking status: Former    Passive exposure: Never   Smokeless tobacco: Never  Scientific laboratory technician Use: Never used  Substance and Sexual Activity   Alcohol use: Never   Drug use: Never   Sexual activity: Not on file  Other Topics Concern   Not on file  Social History Narrative   Lives at home w wife   Right handed   Caffeine: 1 C of coffee and mountain dew a day. Tea sometimes   Social Determinants of  Radio broadcast assistant Strain: Not on file  Food Insecurity: Not on file  Transportation Needs: Not on file  Physical Activity: Not on file  Stress: Not on file  Social Connections: Not on file  Intimate Partner Violence: Not on file     PHYSICAL EXAM  Vitals:   04/22/22 0807  BP: 111/64  Pulse: 72  Weight: 214 lb 8 oz (97.3 kg)  Height: '5\' 10"'$  (1.778 m)     Body mass index is 30.78 kg/m.   General: The patient is well-developed and well-nourished and in no acute distress  HEENT:  Head is [place]/AT.  Sclera are anicteric.  Skin: Extremities are without rash or  edema.   Neurologic Exam  Mental status: The patient is alert and oriented x 2 1/2 at the time of the examination. The patient has reduced short-term memory.  His clock was drawn correctly.  He scored 22/30 on the [place] cognitive assessment.   Speech is normal.  Cranial nerves: Extraocular movements are full.  Facial strength and sensation was normal.   The tongue is midline, and the patient has symmetric elevation of the soft palate. No obvious hearing deficits are noted.  Motor:  Muscle bulk is normal.   Tone is normal. Strength is  5 / 5 in all 4 extremities.   Sensory: Sensory testing is intact to pinprick, soft touch and vibration sensation in all 4 extremities.  Coordination: Cerebellar testing reveals good finger-nose-finger and heel-to-shin bilaterally.  Gait and station: Station is normal.   Gait is normal. Tandem gait is mildly wide.. Romberg is negative.   Reflexes: Deep tendon reflexes are symmetric and normal bilaterally.   Plantar responses are flexor.    DIAGNOSTIC DATA (LABS, IMAGING, TESTING) - I reviewed patient records, labs, notes, testing and imaging myself where available.  Lab Results  Component Value Date   WBC 13.0 (H) 07/31/2009   HGB 14.4 07/31/2009   HCT 41.7 07/31/2009   MCV 91.1 07/31/2009   PLT 182 07/31/2009      Component Value Date/Time   NA 138  08/06/2020 0940   K 4.8 08/06/2020 0940   CL 101 08/06/2020 0940   CO2 24 08/06/2020 0940   GLUCOSE 103 (H) 08/06/2020 0940   GLUCOSE 86 05/05/2019 1144   BUN 9 08/06/2020 0940   CREATININE 1.09 08/06/2020 0940   CREATININE 1.28 (H) 05/05/2019 1144   CALCIUM 9.5 08/06/2020 0940   PROT 7.0 08/06/2020 0940   ALBUMIN 4.4 08/06/2020 0940   AST 20 08/06/2020 0940   ALT 17 08/06/2020 0940   ALKPHOS 102 08/06/2020 0940   BILITOT 0.3 08/06/2020 0940   GFRNONAA 56 (L) 05/05/2019 1144   GFRAA 64 05/05/2019 1144   Lab Results  Component Value Date   CHOL 181 08/06/2020   HDL 43 08/06/2020   LDLCALC 92 08/06/2020   TRIG 272 (H) 08/06/2020   CHOLHDL 4.2 08/06/2020      ASSESSMENT AND PLAN  Alzheimer disease ([place]) - Plan: ATN PROFILE  Memory loss   I believe he most likely has Alzheimer's disease.  Check A/T/N panel.    .  I discussed with the family that this test correlates well with amyloid PET which is considered the gold standard.  As he has medial temporal lobe atrophy and primarily amnestic cognitive change, this test can help confirm his diagnosis. Galantamine 8 mg p.o. twice daily.  He actually scored better on the [facility] cognitive assessment today.  We also discussed lecanemab, both the pros and cons.  He would like to hold off at this point.  He has no behavioral issues.  We did in some time discussing natural history of Alzheimer's. RTC 6 months or sooner if there are new or worsening neurologic symptoms.    Jonathen Rathman A. Felecia Shelling, MD, [facility] [id number], [id number] PM Certified in Neurology, Clinical Neurophysiology, Sleep Medicine and Neuroimaging  [facility] [address] [place], [place] [address] [phone]--

## 2022-04-25 LAB — ATN PROFILE
A -- Beta-amyloid 42/40 Ratio: 0.092 — ABNORMAL LOW (ref >0.102)
Beta-amyloid 40: 217.53 pg/mL
Beta-amyloid 42: 20.02 pg/mL
N -- NfL, Plasma: 2.39 pg/mL (ref 0.00–7.64)
T -- p-tau181: 1.35 pg/mL — ABNORMAL HIGH (ref 0.00–0.97)

## 2022-04-28 ENCOUNTER — Telehealth: Payer: Self-pay | Admitting: Neurology

## 2022-04-28 NOTE — Telephone Encounter (Signed)
The amyloid 42/40 ratio was reduced and the [id number] was elevated.  This pattern is consistent with Alzheimer pathology.  Combined with his clinical presentation, cognitive evaluation and imaging, we can be fairly certain of the diagnosis of Alzheimer's disease.  He is on galantamine and I will recommend that he continue.  I called to go over the results but got voicemail and left a short message

## 2022-04-29 NOTE — Telephone Encounter (Signed)
Discussed A/T/N results.   C/w AD.  Continue galantamine.  We discussed the mAb amyloid drugs with risk and benefits

## 2022-04-29 NOTE — Telephone Encounter (Signed)
Pt returned phone call, would like a call back.  

## 2022-04-29 NOTE — Telephone Encounter (Signed)
Left message for patient to call.

## 2022-05-12 ENCOUNTER — Telehealth: Payer: Self-pay | Admitting: Neurology

## 2022-05-12 NOTE — Telephone Encounter (Signed)
Pt's wife reports that pt's insurance company informed her to call back here to have the tier reinstated for pt's  galantamine (RAZADYNE) 8 MG tablet,

## 2022-05-12 NOTE — Telephone Encounter (Signed)
Please call wife back and let her know tier exception approved via [employer] 02/24/22-02/24/23.

## 2022-05-12 NOTE — Telephone Encounter (Signed)
Called [facility] at [phone]. Spoke w/ tech about galantamine (RAZADYNE) 8 MG tablet . Spoke w/ wife other day, no PA needed. She was informed about high cost. Paid 86.06 for 90 days supply. Informed her that cost is most likely higher d/t being a new year and they may have deductible to meet before cost lowers.   RxBIN: [id number] PCN: [id number] GRP: [id number] IDVU:[id number]  Submitted tier exception request on covermymeds. Key: [id number]. Waiting on determination from [facility].

## 2022-05-12 NOTE — Telephone Encounter (Signed)
Called and LVM of this information ok per DPR. Left office number for her to call back if she has any questions.

## 2022-07-22 ENCOUNTER — Telehealth: Payer: Self-pay | Admitting: Cardiology

## 2022-07-22 NOTE — Telephone Encounter (Signed)
Per Mae his cholesterol was what Dr. Marigene Ehlers office felt he needed to discuss with you.

## 2022-07-22 NOTE — Telephone Encounter (Signed)
Patient would like to know if he needs to have lab work prior to 7/30 appointment with Dr. Dulce Sellar. He mentions that his PCP recently did labs and advised to follow up with cardiology ASAP.

## 2022-09-17 ENCOUNTER — Other Ambulatory Visit: Payer: Self-pay

## 2022-09-22 NOTE — Progress Notes (Unsigned)
Cardiology Office Note:    Date:  09/23/2022   ID:  Sean Marks 06-06-1945, MRN [id number]  PCP:  Simone Curia, MD  Cardiologist:  Norman Herrlich, MD    Referring MD: Simone Curia, MD    ASSESSMENT:    1. CAD in native artery   2. Essential hypertension   3. Mixed hyperlipidemia   4. Alzheimer's dementia, unspecified dementia severity, unspecified timing of dementia onset, unspecified whether behavioral, psychotic, or mood disturbance or anxiety (HCC)    PLAN:    In order of problems listed above:  I have known Sean Marks for decades he is doing well from a cardiovascular perspective not having angina EKG is normal and will continue his medical therapy including aspirin statin and ACE inhibitor for endothelial effect and hypertension at this time he does not need an ischemia evaluation. Blood pressure is well-controlled continue ACE inhibitor low-dose Continue current lipid-lowering therapy combined simvastatin and fish oil supplement. Being managed by neurology he is frustrated and tells me has not had a response to therapy with [facility]   Next appointment: 6 months   Medication Adjustments/Labs and Tests Ordered: Current medicines are reviewed at length with the patient today.  Concerns regarding medicines are outlined above.  Orders Placed This Encounter  Procedures   EKG 12-Lead   No orders of the defined types were placed in this encounter.    History of Present Illness:    Sean Marks is a 77 y.o. male with a hx of CAD with previous remote PCI of the left circumflex coronary artery hypertension hyperlipidemia and dementia last seen 12/11/2021.  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  Lipid profile 07/22/2022 with total cholesterol 173 LDL 98 triglycerides 215 HDL 47 Past Medical History:  Diagnosis Date   Acute pain of left knee 04/18/2014   Asthma    CAD in native artery 12/05/2014   Contusion of right foot 08/20/2017   Coronary artery disease    Dyslipidemia 12/16/2016   H/O heart artery stent 09/17/2018   Hamstring strain, left, initial encounter 02/19/2018   Hyperlipidemia    Hypertension    Pain in right buttock 03/05/2017   Primary osteoarthritis of left knee 02/19/2018   Primary osteoarthritis of right knee 04/18/2014   Radiculopathy of lumbar region 04/03/2017   Right foot pain 08/20/2017   S/P TKR (total knee replacement) using cement, 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   galantamine (RAZADYNE) 8 MG tablet Take 1 tablet (8 mg total) by mouth 2 (two) times daily.   nitroGLYCERIN (NITROSTAT) 0.4 MG SL tablet Place 0.4 mg under the tongue every 5 (five) minutes as needed for chest pain.   Omega-3 Fatty Acids (FISH OIL) 1000 MG CAPS Take 1 capsule (1,000 mg  total) by mouth 2 (two) times daily.   omeprazole (PRILOSEC) 40 MG capsule Take 40 mg by mouth every other day.   ramipril (ALTACE) 2.5 MG capsule Take 2.5 mg by mouth 2 (two) times daily.   simvastatin (ZOCOR) 40 MG tablet TAKE 1 TABLET BY MOUTH EVERYDAY AT BEDTIME   Current Facility-Administered Medications for the 09/23/22 encounter (Office Visit) with [name], MD  Medication   methylPREDNISolone acetate (DEPO-MEDROL) injection 80 mg      EKGs/Labs/Other Studies  Reviewed:    The following studies were reviewed today:  Cardiac Studies & Procedures     STRESS TESTS  MYOCARDIAL PERFUSION IMAGING 01/26/2020  Narrative  The left ventricular ejection fraction is normal (55-65%).  Nuclear stress EF: 56%.  There was no ST segment deviation noted during stress.  The study is normal.  This is a low risk study.              EKG Interpretation Date/Time:  Tuesday September 23 2022 08:02:32 EDT Ventricular Rate:  59 PR Interval:  180 QRS Duration:  100 QT Interval:  414 QTC Calculation: 409 R Axis:   77  Text Interpretation: Sinus bradycardia with marked sinus arrhythmia When compared with ECG of 31-Jul-2009 11:59, No significant change was found Confirmed by Norman Herrlich ([id number]) on 09/23/2022 8:06:26 AMEKG Interpretation Date/Time:  Tuesday September 23 2022 08:02:32 EDT Ventricular Rate:  59 PR Interval:  180 QRS Duration:  100 QT Interval:  414 QTC Calculation: 409 R Axis:   77  Text Interpretation: Sinus bradycardia with marked sinus arrhythmia When compared with ECG of 31-Jul-2009 11:59, No significant change was found Confirmed by Norman Herrlich ([id number]) on 09/23/2022 8:06:26 AM       Physical Exam:    VS:  BP 106/64   Pulse (!) 59   Ht 5\' 11"  (1.803 m)   Wt 207 lb 6.4 oz (94.1 kg)   SpO2 96%   BMI 28.93 kg/m     Wt Readings from Last 3 Encounters:  09/23/22 207 lb 6.4 oz (94.1 kg)  04/22/22 214 lb 8 oz (97.3 kg)  12/11/21 231 lb (104.8 kg)     GEN:  Well nourished, well developed in no acute distress HEENT: Normal NECK: No JVD; No carotid bruits LYMPHATICS: No lymphadenopathy CARDIAC: RRR, no murmurs, rubs, gallops RESPIRATORY:  Clear to auscultation without rales, wheezing or rhonchi  ABDOMEN: Soft, non-tender, non-distended MUSCULOSKELETAL:  No edema; No deformity  SKIN: Warm and dry NEUROLOGIC:  Alert and oriented x 3 PSYCHIATRIC:  Normal affect    Signed, Norman Herrlich, MD  09/23/2022 8:38 AM    [facility]

## 2022-09-23 ENCOUNTER — Ambulatory Visit: Payer: Medicare HMO | Attending: Cardiology | Admitting: Cardiology

## 2022-09-23 ENCOUNTER — Encounter: Payer: Self-pay | Admitting: Cardiology

## 2022-09-23 VITALS — BP 106/64 | HR 59 | Ht 71.0 in | Wt 207.4 lb

## 2022-09-23 DIAGNOSIS — I10 Essential (primary) hypertension: Secondary | ICD-10-CM | POA: Diagnosis not present

## 2022-09-23 DIAGNOSIS — G309 Alzheimer's disease, unspecified: Secondary | ICD-10-CM

## 2022-09-23 DIAGNOSIS — E782 Mixed hyperlipidemia: Secondary | ICD-10-CM

## 2022-09-23 DIAGNOSIS — I2510 Atherosclerotic heart disease of native coronary artery without angina pectoris: Secondary | ICD-10-CM | POA: Diagnosis not present

## 2022-09-23 DIAGNOSIS — F0280 Dementia in other diseases classified elsewhere without behavioral disturbance: Secondary | ICD-10-CM

## 2022-09-23 NOTE — Patient Instructions (Signed)

## 2022-10-15 ENCOUNTER — Other Ambulatory Visit: Payer: Self-pay | Admitting: Neurology

## 2022-11-05 ENCOUNTER — Other Ambulatory Visit: Payer: Self-pay | Admitting: Neurology

## 2022-11-05 NOTE — Telephone Encounter (Signed)
Last seen on 04/22/22 Follow up scheduled on 04/20/22

## 2022-11-14 ENCOUNTER — Telehealth: Payer: Self-pay

## 2022-11-14 NOTE — Telephone Encounter (Signed)
..  Pre-operative Risk Assessment    Patient Name: Sean Marks  DOB: 11-Oct-1945 MRN: [id number]      Request for Surgical Clearance    Procedure:   LITHOTRIPSY    Date of Surgery:  Clearance 01/02/23                                 Surgeon:  Loreli Slot Surgeon's Group or Practice Name:  [facility] Phone number:  [phone] Fax number:  [phone]   Type of Clearance Requested:   - Medical  - Pharmacy:  Hold Aspirin     Type of Anesthesia:   CONSCIENCE SEDATION   Additional requests/questions:   LAST APPT 09/23/22, NO NEW APPT  Signed, Renee Ramus   11/14/2022, 9:19 AM

## 2022-11-17 ENCOUNTER — Telehealth: Payer: Self-pay | Admitting: Cardiology

## 2022-11-17 NOTE — Telephone Encounter (Signed)
Pt has been scheduled a tele visit, 11/28/22 9:20.  Consent on file / medications reconciled.    Patient Consent for Virtual Visit        Sean Marks has provided verbal consent on 11/17/2022 for a virtual visit (video or telephone).   CONSENT FOR VIRTUAL VISIT FOR:  Sean Marks  By participating in this virtual visit I agree to the following:  I hereby voluntarily request, consent and authorize [facility] and its employed or contracted physicians, physician assistants, nurse practitioners or other licensed health care professionals (the Practitioner), to provide me with telemedicine health care services (the "Services") as deemed necessary by the treating Practitioner. I acknowledge and consent to receive the Services by the Practitioner via telemedicine. I understand that the telemedicine visit will involve communicating with the Practitioner through live audiovisual communication technology and the disclosure of certain medical information by electronic transmission. I acknowledge that I have been given the opportunity to request an in-person assessment or other available alternative prior to the telemedicine visit and am voluntarily participating in the telemedicine visit.  I understand that I have the right to withhold or withdraw my consent to the use of telemedicine in the course of my care at any time, without affecting my right to future care or treatment, and that the Practitioner or I may terminate the telemedicine visit at any time. I understand that I have the right to inspect all information obtained and/or recorded in the course of the telemedicine visit and may receive copies of available information for a reasonable fee.  I understand that some of the potential risks of receiving the Services via telemedicine include:  Delay or interruption in medical evaluation due to technological equipment failure or disruption; Information transmitted may not be sufficient (e.g. poor  resolution of images) to allow for appropriate medical decision making by the Practitioner; and/or  In rare instances, security protocols could fail, causing a breach of personal health information.  Furthermore, I acknowledge that it is my responsibility to provide information about my medical history, conditions and care that is complete and accurate to the best of my ability. I acknowledge that Practitioner's advice, recommendations, and/or decision may be based on factors not within their control, such as incomplete or inaccurate data provided by me or distortions of diagnostic images or specimens that may result from electronic transmissions. I understand that the practice of medicine is not an exact science and that Practitioner makes no warranties or guarantees regarding treatment outcomes. I acknowledge that a copy of this consent can be made available to me via my patient portal [facility] MyChart), or I can request a printed copy by calling the office of [facility].    I understand that my insurance will be billed for this visit.   I have read or had this consent read to me. I understand the contents of this consent, which adequately explains the benefits and risks of the Services being provided via telemedicine.  I have been provided ample opportunity to ask questions regarding this consent and the Services and have had my questions answered to my satisfaction. I give my informed consent for the services to be provided through the use of telemedicine in my medical care

## 2022-11-17 NOTE — Telephone Encounter (Signed)
Follow Up:     Patient's wife is calling back to schedule Pre Op Phone visit.t

## 2022-11-17 NOTE — Telephone Encounter (Signed)
Name: Sean Marks  DOB: [birth year]  MRN: [id number]  Primary Cardiologist: None   Preoperative team, please contact this patient and set up a phone call appointment for further preoperative risk assessment. Please obtain consent and complete medication review. Thank you for your help.  I confirm that guidance regarding antiplatelet and oral anticoagulation therapy has been completed and, if necessary, noted below.  Ideally aspirin should be continued without interruption, however if the bleeding risk is too great, aspirin may be held for 5-7 days prior to surgery. Please resume aspirin post operatively when it is felt to be safe from a bleeding standpoint.     Carlos Levering, NP 11/17/2022, 11:13 AM [facility]

## 2022-11-17 NOTE — Telephone Encounter (Signed)
Pt has been scheduled a tele visit, 11/28/22 9:20.  Consent on file / medications reconciled.

## 2022-11-17 NOTE — Telephone Encounter (Signed)
1st attempt at scheduling tele preop appt. [facility]

## 2022-11-26 NOTE — Progress Notes (Signed)
Virtual Visit via Telephone Note   Because of Sean Marks co-morbid illnesses, he is at least at moderate risk for complications without adequate follow up.  This format is felt to be most appropriate for this patient at this time.  The patient did not have access to video technology/had technical difficulties with video requiring transitioning to audio format only (telephone).  All issues noted in this document were discussed and addressed.  No physical exam could be performed with this format.  Please refer to the patient's chart for his consent to telehealth for [facility].  Evaluation Performed:  Preoperative cardiovascular risk assessment _____________   Date:  11/28/2022   Patient ID:  Sean Marks, DOB [birth year], MRN [id number] Patient Location:  Home Provider location:   Office  Primary Care Provider:  Simone Curia, MD Primary Cardiologist:    Chief Complaint / Patient Profile   77 y.o. y/o male with a h/o CAD, HTN, Mixed HL, Alzheimer's dementia    He is pending lithotripsy by [facility], 01/02/2023, and presents today for telephonic preoperative cardiovascular risk assessment. Recommendations to hold ASA.   History of Present Illness    Sean Marks is a 77 y.o. male who presents via audio/video conferencing for a telehealth visit today.  Pt was last seen in cardiology clinic on 09/23/2022 by Dr.Munley.  At that time Sean Marks was doing well .  The patient is now pending procedure as outlined above. Since his last visit, he   Past Medical History    Past Medical History:  Diagnosis Date   Acute pain of left knee 04/18/2014   Asthma    CAD in native artery 12/05/2014   Contusion of right foot 08/20/2017   Coronary artery disease    Dyslipidemia 12/16/2016   H/O heart artery stent 09/17/2018   Hamstring strain, left, initial encounter 02/19/2018   Hyperlipidemia    Hypertension    Pain in right buttock 03/05/2017   Primary  osteoarthritis of left knee 02/19/2018   Primary osteoarthritis of right knee 04/18/2014   Radiculopathy of lumbar region 04/03/2017   Right foot pain 08/20/2017   S/P TKR (total knee replacement) using cement, 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 Authorizing Provider  aspirin EC 81 MG tablet Take 1 tablet (81 mg total) by mouth every other day. 03/25/19   [name], MD  galantamine (RAZADYNE) 8 MG tablet TAKE 1 TABLET (8 MG TOTAL) BY MOUTH 2 (TWO) TIMES DAILY. 11/05/22   Sater, Pearletha Furl, MD  nitroGLYCERIN (NITROSTAT) 0.4 MG SL tablet Place 0.4 mg under the tongue every 5 (five) minutes as needed for chest pain. 02/27/16   [provider]  Omega-3 Fatty Acids (FISH OIL) 1000 MG CAPS Take 1 capsule (1,000 mg total) by mouth 2 (two) times daily. 03/25/19   [name], MD  omeprazole (PRILOSEC) 40 MG capsule Take 40 mg by mouth every other day.    [provider]  ramipril (ALTACE) 2.5 MG capsule Take 2.5 mg by mouth 2 (two) times  daily. 06/30/22   [provider]  simvastatin (ZOCOR) 40 MG tablet TAKE 1 TABLET BY MOUTH EVERYDAY AT BEDTIME 05/17/20   Thomasene Ripple, DO    Physical Exam    Vital Signs:  Sean Marks does not have vital signs available for review today.  Given telephonic nature of communication, physical exam is limited. AAOx3. NAD. Normal affect.  Speech and respirations are unlabored.  Accessory Clinical Findings    None  Assessment & Plan    1.  Preoperative Cardiovascular Risk Assessment:  According to the Revised Cardiac Risk Index (RCRI), his Perioperative Risk of Major  Cardiac Event is (%): 0.4  His Functional Capacity in METs is: 8.97 according to the Duke Activity Status Index (DASI).   Per office protocol, if patient is without any new symptoms or concerns at the time of their virtual visit, he/she may hold ASA for 7 days prior to procedure. Please resume ASA as soon as possible postprocedure, at the discretion of the surgeon.  Stop on 12/26/2022.   The patient was advised that if he develops new symptoms prior to surgery to contact our office to arrange for a follow-up visit, and he verbalized understanding.  Therefore, based on ACC/AHA guidelines, patient would be at acceptable risk for the planned procedure without further cardiovascular testing. I will route this recommendation to the requesting party via Epic fax function.    A copy of this note will be routed to requesting surgeon.   Time:   Today, I have spent 10 minutes with the patient with telehealth technology discussing medical history, symptoms, and management plan.     Joni Reining, NP  11/28/2022, 11:29 AM

## 2022-11-28 ENCOUNTER — Ambulatory Visit: Payer: Medicare HMO | Attending: Cardiology

## 2022-11-28 DIAGNOSIS — Z01818 Encounter for other preprocedural examination: Secondary | ICD-10-CM

## 2022-11-28 DIAGNOSIS — Z01810 Encounter for preprocedural cardiovascular examination: Secondary | ICD-10-CM

## 2023-03-24 NOTE — Progress Notes (Unsigned)
Cardiology Office Note:    Date:  03/25/2023   ID:  Sean Marks, Sean Marks September 18, 1945, MRN [id number]  PCP:  Sean Curia, MD  Cardiologist:  Sean Herrlich, MD    Referring MD: Sean Curia, MD    ASSESSMENT:    1. CAD in native artery   2. Essential hypertension   3. Mixed hyperlipidemia    PLAN:    In order of problems listed above:  He has a history of CAD with remote PCI he is having chest pain not typical angina may well be costochondral pain he does heavy lifting with farming but in view of his background history of being a cigarette smoker he needs a chest x-ray performed and also with remote PCI and CAD will do a myocardial perfusion study in our office. 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   Medication Adjustments/Labs and Tests Ordered: Current medicines are reviewed at length with the patient today.  Concerns regarding medicines are outlined above.  No orders of the defined types were placed in this encounter.  No orders of the defined types were placed in this encounter.    History of Present Illness:    Sean Marks is a 78 y.o. male with a hx of CAD with previous remote PCI of left circumflex coronary artery hypertension hyperlipidemia and unfortunately dementia last seen 09/23/2022.  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  He has a history of remote smoking as a young adult for about 10 years we discussed a chest x-ray and he started to discuss having a lot of x-rays at [facility] but I think it was a urologic problem perhaps stones he has an element of dementia.  He is not having edema orthopnea palpitation or syncope.  He tolerates his statin without muscle pain or weakness. 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   galantamine (RAZADYNE) 8 MG tablet  TAKE 1 TABLET (8 MG TOTAL) BY MOUTH 2 (TWO) TIMES DAILY.   nitroGLYCERIN (NITROSTAT) 0.4 MG SL tablet Place 0.4 mg under the tongue every 5 (five) minutes as needed for chest pain.   Omega-3 Fatty Acids (FISH OIL) 1000 MG CAPS Take 1 capsule (1,000 mg total) by mouth 2 (two) times daily.   omeprazole (PRILOSEC) 40 MG capsule Take 40 mg by mouth every other day.   ramipril (ALTACE) 2.5 MG capsule Take 2.5 mg by mouth 2 (two) times daily.   simvastatin (ZOCOR) 40 MG tablet TAKE 1 TABLET BY MOUTH EVERYDAY AT BEDTIME   Current Facility-Administered Medications for the 03/25/23 encounter (Office Visit) with  [name], MD  Medication   methylPREDNISolone acetate (DEPO-MEDROL) injection 80 mg      EKGs/Labs/Other Studies Reviewed:    The following studies were reviewed today:  Cardiac Studies & Procedures     STRESS TESTS  MYOCARDIAL PERFUSION IMAGING 01/26/2020  Narrative  The left ventricular ejection fraction is normal (55-65%).  Nuclear stress EF: 56%.  There was no ST segment deviation noted during stress.  The study is normal.  This is a low risk study.             Lipid profile May 2023 LDL 101 cholesterol 171  .hcm Physical Exam:    VS:  BP 110/64   Pulse (!) 58   Ht 5\' 11"  (1.803 m)   Wt 212 lb 12.8 oz (96.5 kg)   SpO2 94%   BMI 29.68 kg/m     Wt Readings from Last 3 Encounters:  03/25/23 212 lb 12.8 oz (96.5 kg)  09/23/22 207 lb 6.4 oz (94.1 kg)  04/22/22 214 lb 8 oz (97.3 kg)     GEN:  Well nourished, well developed in no acute distress HEENT: Normal NECK: No JVD; No carotid bruits LYMPHATICS: No lymphadenopathy CARDIAC: He has tenderness left costochondral junction that seems to reproduce his complaint RRR, no murmurs, rubs, gallops RESPIRATORY:  Clear to auscultation without rales, wheezing or rhonchi  ABDOMEN: Soft, non-tender, non-distended MUSCULOSKELETAL:  No edema; No deformity  SKIN: Warm and dry NEUROLOGIC:  Alert and oriented x 3 PSYCHIATRIC:  Normal affect    Signed, Sean Herrlich, MD  03/25/2023 8:22 AM    [facility]

## 2023-03-25 ENCOUNTER — Ambulatory Visit (INDEPENDENT_AMBULATORY_CARE_PROVIDER_SITE_OTHER)
Admission: RE | Admit: 2023-03-25 | Discharge: 2023-03-25 | Disposition: A | Discharge status: Home / Self Care | Payer: Medicare HMO | Source: Ambulatory Visit | Attending: Cardiology | Admitting: Cardiology

## 2023-03-25 ENCOUNTER — Ambulatory Visit: Payer: Medicare HMO | Attending: Cardiology | Admitting: Cardiology

## 2023-03-25 ENCOUNTER — Encounter: Payer: Self-pay | Admitting: Cardiology

## 2023-03-25 VITALS — BP 110/64 | HR 58 | Ht 71.0 in | Wt 212.8 lb

## 2023-03-25 DIAGNOSIS — I10 Essential (primary) hypertension: Secondary | ICD-10-CM | POA: Diagnosis not present

## 2023-03-25 DIAGNOSIS — R079 Chest pain, unspecified: Secondary | ICD-10-CM | POA: Diagnosis not present

## 2023-03-25 DIAGNOSIS — I2510 Atherosclerotic heart disease of native coronary artery without angina pectoris: Secondary | ICD-10-CM

## 2023-03-25 DIAGNOSIS — E782 Mixed hyperlipidemia: Secondary | ICD-10-CM

## 2023-03-25 MED ORDER — CELECOXIB 100 MG PO CAPS: 100.0000 mg | ORAL_CAPSULE | Freq: Two times a day (BID) | ORAL | 0 refills | Status: DC

## 2023-03-25 NOTE — Addendum Note (Signed)
Addended by: Roxanne Mins I on: 03/25/2023 10:39 AM   Modules accepted: Orders

## 2023-03-25 NOTE — Patient Instructions (Addendum)
Medication Instructions:  Your physician has recommended you make the following change in your medication:   START: Celebrex 100 mg two times daily  *If you need a refill on your cardiac medications before your next appointment, please call your pharmacy*   Lab Work: Your physician recommends that you return for lab work in:   Labs today: CMP, Lipids  If you have labs (blood work) drawn today and your tests are completely normal, you will receive your results only by: MyChart Message (if you have MyChart) OR A paper copy in the mail If you have any lab test that is abnormal or we need to change your treatment, we will call you to review the results.   Testing/Procedures: A chest x-ray takes a picture of the organs and structures inside the chest, including the heart, lungs, and blood vessels. This test can show several things, including, whether the heart is enlarges; whether fluid is building up in the lungs; and whether pacemaker / defibrillator leads are still in place.     [facility] [address] [place], [place] [address] Phone:  [phone]    Please arrive 15 minutes prior to your appointment time for registration and insurance purposes.  The test will take approximately 3 to 4 hours to complete; you may bring reading material.  If someone comes with you to your appointment, they will need to remain in the main lobby due to limited space in the testing area. **If you are pregnant or breastfeeding, please notify the nuclear lab prior to your appointment**  How to prepare for your Myocardial Perfusion Test: Do not eat or drink 3 hours prior to your test, except you may have water. Do not consume products containing caffeine (regular or decaffeinated) 12 hours prior to your test. (ex: coffee, chocolate, sodas, tea). Do bring a list of your current medications with you.  If not listed below, you may take your medications as normal. Do wear  comfortable clothes (no dresses or overalls) and walking shoes, tennis shoes preferred (No heels or open toe shoes are allowed). Do NOT wear cologne, perfume, aftershave, or lotions (deodorant is allowed). If these instructions are not followed, your test will have to be rescheduled.  Please report to [address] for your test.  If you have questions or concerns about your appointment, you can call the [facility] at [phone].  If you cannot keep your appointment, please provide 24 hours notification to the Nuclear Lab, to avoid a possible $50 charge to your account.    Follow-Up: At [facility], you and your health needs are our priority.  As part of our continuing mission to provide you with exceptional heart care, we have created designated Provider Care Teams.  These Care Teams include your primary Cardiologist (physician) and Advanced Practice Providers (APPs -  Physician Assistants and Nurse Practitioners) who all work together to provide you with the care you need, when you need it.  We recommend signing up for the patient portal called "MyChart".  Sign up information is provided on this After Visit Summary.  MyChart is used to connect with patients for Virtual Visits (Telemedicine).  Patients are able to view lab/test results, encounter notes, upcoming appointments, etc.  Non-urgent messages can be sent to your provider as well.   To learn more about what you can do with MyChart, go to [email or web address].    Your next appointment:   6 week(s)  Provider:  Wallis Bamberg, NP Rosalita Levan)    Other Instructions None

## 2023-03-26 ENCOUNTER — Ambulatory Visit: Payer: Medicare HMO | Attending: Cardiology

## 2023-03-26 ENCOUNTER — Telehealth: Payer: Self-pay | Admitting: Cardiology

## 2023-03-26 DIAGNOSIS — I10 Essential (primary) hypertension: Secondary | ICD-10-CM | POA: Diagnosis not present

## 2023-03-26 DIAGNOSIS — I2510 Atherosclerotic heart disease of native coronary artery without angina pectoris: Secondary | ICD-10-CM | POA: Diagnosis not present

## 2023-03-26 DIAGNOSIS — E782 Mixed hyperlipidemia: Secondary | ICD-10-CM | POA: Diagnosis not present

## 2023-03-26 LAB — LIPID PANEL
Chol/HDL Ratio: 3.7 {ratio} (ref 0.0–5.0)
Cholesterol, Total: 183 mg/dL (ref 100–199)
HDL: 49 mg/dL (ref >39)
LDL Chol Calc (NIH): 100 mg/dL — ABNORMAL HIGH (ref 0–99)
Triglycerides: 199 mg/dL — ABNORMAL HIGH (ref 0–149)
VLDL Cholesterol Cal: 34 mg/dL (ref 5–40)

## 2023-03-26 LAB — COMPREHENSIVE METABOLIC PANEL
ALT: 20 [IU]/L (ref 0–44)
AST: 22 [IU]/L (ref 0–40)
Albumin: 4.6 g/dL (ref 3.8–4.8)
Alkaline Phosphatase: 77 [IU]/L (ref 44–121)
BUN/Creatinine Ratio: 12 (ref 10–24)
BUN: 16 mg/dL (ref 8–27)
Bilirubin Total: 0.4 mg/dL (ref 0.0–1.2)
CO2: 23 mmol/L (ref 20–29)
Calcium: 9.6 mg/dL (ref 8.6–10.2)
Chloride: 102 mmol/L (ref 96–106)
Creatinine, Ser: 1.32 mg/dL — ABNORMAL HIGH (ref 0.76–1.27)
Globulin, Total: 2.6 g/dL (ref 1.5–4.5)
Glucose: 102 mg/dL — ABNORMAL HIGH (ref 70–99)
Potassium: 4.6 mmol/L (ref 3.5–5.2)
Sodium: 139 mmol/L (ref 134–144)
Total Protein: 7.2 g/dL (ref 6.0–8.5)
eGFR: 56 mL/min/{1.73_m2} — ABNORMAL LOW (ref >59)

## 2023-03-26 MED ORDER — TECHNETIUM TC 99M TETROFOSMIN IV KIT
32.7000 | PACK | Freq: Once | INTRAVENOUS | Status: AC | PRN
Start: 2023-03-26 — End: 2023-03-26
  Administered 2023-03-26: 32.7 via INTRAVENOUS

## 2023-03-26 MED ORDER — REGADENOSON 0.4 MG/5ML IV SOLN
0.4000 mg | Freq: Once | INTRAVENOUS | Status: AC
  Administered 2023-03-26: 0.4 mg via INTRAVENOUS

## 2023-03-26 MED ORDER — TECHNETIUM TC 99M TETROFOSMIN IV KIT
10.3000 | PACK | Freq: Once | INTRAVENOUS | Status: AC | PRN
Start: 2023-03-26 — End: 2023-03-26
  Administered 2023-03-26: 10.3 via INTRAVENOUS

## 2023-03-26 NOTE — Telephone Encounter (Signed)
Patient's spouse was returning phone call

## 2023-03-26 NOTE — Telephone Encounter (Signed)
Patient's wife informed of results.

## 2023-04-01 LAB — MYOCARDIAL PERFUSION IMAGING
LV dias vol: 88 mL (ref 62–150)
LV sys vol: 30 mL
Nuc Stress EF: 66 %
Peak HR: 109 {beats}/min
Rest HR: 63 {beats}/min
Rest Nuclear Isotope Dose: 10.3 mCi
SDS: 2
SRS: 6
SSS: 8
ST Depression (mm): 0 mm
Stress Nuclear Isotope Dose: 32.7 mCi
TID: 0.93

## 2023-04-02 ENCOUNTER — Telehealth: Payer: Self-pay | Admitting: Cardiology

## 2023-04-02 NOTE — Telephone Encounter (Signed)
 Wife (Mae) returned RN's call regarding test results.

## 2023-04-02 NOTE — Telephone Encounter (Signed)
 Patient's wife informed of the patient's results per [facility]

## 2023-04-02 NOTE — Telephone Encounter (Signed)
 Patient's wife returned RN's call again regarding results.

## 2023-04-02 NOTE — Telephone Encounter (Signed)
Left message for the patients wife to call back. 

## 2023-04-08 ENCOUNTER — Other Ambulatory Visit: Payer: Self-pay

## 2023-04-08 DIAGNOSIS — R911 Solitary pulmonary nodule: Secondary | ICD-10-CM

## 2023-04-09 ENCOUNTER — Ambulatory Visit (HOSPITAL_BASED_OUTPATIENT_CLINIC_OR_DEPARTMENT_OTHER)
Admission: RE | Admit: 2023-04-09 | Discharge: 2023-04-09 | Disposition: A | Discharge status: Home / Self Care | Payer: Medicare HMO | Source: Ambulatory Visit | Attending: Cardiology | Admitting: Cardiology

## 2023-04-09 DIAGNOSIS — R911 Solitary pulmonary nodule: Secondary | ICD-10-CM | POA: Diagnosis not present

## 2023-04-21 ENCOUNTER — Ambulatory Visit: Payer: Medicare HMO | Admitting: Neurology

## 2023-04-21 ENCOUNTER — Encounter: Payer: Self-pay | Admitting: Neurology

## 2023-04-21 VITALS — BP 139/73 | HR 66 | Ht 71.5 in | Wt 215.0 lb

## 2023-04-21 DIAGNOSIS — G309 Alzheimer's disease, unspecified: Secondary | ICD-10-CM

## 2023-04-21 DIAGNOSIS — I159 Secondary hypertension, unspecified: Secondary | ICD-10-CM

## 2023-04-21 DIAGNOSIS — R413 Other amnesia: Secondary | ICD-10-CM

## 2023-04-21 DIAGNOSIS — F0280 Dementia in other diseases classified elsewhere without behavioral disturbance: Secondary | ICD-10-CM | POA: Insufficient documentation

## 2023-04-21 DIAGNOSIS — I2510 Atherosclerotic heart disease of native coronary artery without angina pectoris: Secondary | ICD-10-CM | POA: Diagnosis not present

## 2023-04-21 HISTORY — DX: Other amnesia: R41.3

## 2023-04-21 HISTORY — DX: Dementia in other diseases classified elsewhere, unspecified severity, without behavioral disturbance, psychotic disturbance, mood disturbance, and anxiety: F02.80

## 2023-04-21 MED ORDER — GALANTAMINE HYDROBROMIDE 8 MG PO TABS: 8.0000 mg | ORAL_TABLET | Freq: Two times a day (BID) | ORAL | 3 refills | Status: AC

## 2023-04-21 NOTE — Progress Notes (Signed)
 [facility]  PATIENT: Sean Marks DOB: 1945/10/24  REFERRING DOCTOR OR PCP: Norman Herrlich, MD; Simone Curia MD (PCP) SOURCE: Patient, notes from other doctors, lab results, cognitive testing.  _________________________________   HISTORICAL  CHIEF COMPLAINT:  Chief Complaint  Patient presents with   Memory Loss    Rm10, wife present, Memory loss: moca 17    HISTORY OF PRESENT ILLNESS:  Sean Marks is a 78 y.o. man with progressive cognitive decline since 2017.  Update 04/21/2023 Since the last visit, he had the ATN test and was found to have reduced AB42/40 ratio and elevated pTau181 c/w Alzheimer's disease.    He had earlier tried donepezil but had nightmares on donepezil and stopped.   We started galantamine 4 mg p.o. twice daily.  He tolerated it well and dose was increased to 8 mg po bid.    He is fairly active.  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  He spends some of his day doing chores.  He is otherwise in good health.   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    Both of his parents were cognitively sharp until they died of old age.  His brothers and sisters have no cognitive  Two uncles (on maternal side)had Alzheimer's disease.          04/21/2023    8:12 AM 04/22/2022    8:09 AM 10/24/2021    8:15 AM 05/16/2021   10:28 AM  Montreal Cognitive Assessment   Visuospatial/ Executive (0/5) 3 3 4 3   Naming (0/3) 2 1 2 3   Attention: Read list of digits (0/2) 2 2 2 2   Attention: Read list of letters (0/1) 0 1 1 1   Attention: Serial 7 subtraction starting at 100 (0/3) 1 3 1 1   Language: Repeat phrase (0/2) 0 2 1 2   Language : Fluency (0/1) 0 1 0 0  Abstraction (0/2) 2 2 1 2   Delayed Recall (0/5) 1 1 0 0  Orientation (0/6) 6 5 5 6   Total 17 21 17 20   Adjusted Score (based on education)  22 18     H/O  cognitive issues: He has noted mild memory issues about 2017.  Both he and his wife note he has gradually progressed but generally the same as last visit 6 months ago.   As examples, he has difficulties with driving directions more recently, directions with driving are more difficult but he has not been lost.    He has no difficulties with most activities of daily living.  The memory and other cognitive decline has not been associated with any physical change.   In February 2024,  ATN test and was found to have reduced AB42/40 ratio and elevated pTau181 c/w Alzheimer's disease.  IMAGING: MRI of the brain 11/27/2021 shows mild to moderate generalized cortical atrophy, a little more involving the medial temporal lobes.  Third ventricle is enlarged.  There is also mild chronic microvascular ischemic change.  The MRI is unchanged compared to the 05/27/2021 MRI.  REVIEW OF SYSTEMS: Constitutional: No fevers, chills, sweats, or change in appetite Eyes: No visual changes, double vision, eye pain Ear, nose and throat: No hearing loss, ear pain, nasal congestion, sore throat Cardiovascular: No chest pain, palpitations Respiratory:  No shortness of breath at rest or with  exertion.   No wheezes GastrointestinaI: No nausea, vomiting, diarrhea, abdominal pain, fecal incontinence Genitourinary:  No dysuria, urinary retention or frequency.  No nocturia. Musculoskeletal:  No neck pain, back pain Integumentary: No rash, pruritus, skin lesions Neurological: as above Psychiatric: No depression at this time.  No anxiety Endocrine: No palpitations, diaphoresis, change in appetite, change in weigh or increased thirst Hematologic/Lymphatic:  No anemia, purpura, petechiae. Allergic/Immunologic: No itchy/runny eyes, nasal congestion, recent allergic reactions, rashes  ALLERGIES: Allergies  Allergen Reactions   Oxycodone Nausea Only   Donepezil Other (See Comments)    Nightmares    HOME MEDICATIONS:  Current  Outpatient Medications:    aspirin EC 81 MG tablet, Take 1 tablet (81 mg total) by mouth every other day., Disp: 90 tablet, Rfl: 2   nitroGLYCERIN (NITROSTAT) 0.4 MG SL tablet, Place 0.4 mg under the tongue every 5 (five) minutes as needed for chest pain., Disp: , Rfl:    Omega-3 Fatty Acids (FISH OIL) 1000 MG CAPS, Take 1 capsule (1,000 mg total) by mouth 2 (two) times daily., Disp: 180 capsule, Rfl: 1   omeprazole (PRILOSEC) 40 MG capsule, Take 40 mg by mouth every other day., Disp: , Rfl:    ramipril (ALTACE) 2.5 MG capsule, Take 2.5 mg by mouth 2 (two) times daily., Disp: , Rfl:    simvastatin (ZOCOR) 40 MG tablet, TAKE 1 TABLET BY MOUTH EVERYDAY AT BEDTIME, Disp: 90 tablet, Rfl: 2   galantamine (RAZADYNE) 8 MG tablet, Take 1 tablet (8 mg total) by mouth 2 (two) times daily., Disp: 180 tablet, Rfl: 3  Current Facility-Administered Medications:    methylPREDNISolone acetate (DEPO-MEDROL) injection 80 mg, 80 mg, Intramuscular, Once, Naida Sleight, PA-C  PAST MEDICAL HISTORY: Past Medical History:  Diagnosis Date   Acute pain of left knee 04/18/2014   Asthma    CAD in native artery 12/05/2014   Contusion of right foot 08/20/2017   Coronary artery disease    Dyslipidemia 12/16/2016   H/O heart artery stent 09/17/2018   Hamstring strain, left, initial encounter 02/19/2018   Hyperlipidemia    Hypertension    Pain in right buttock 03/05/2017   Primary osteoarthritis of left knee 02/19/2018   Primary osteoarthritis of right knee 04/18/2014   Radiculopathy of lumbar region 04/03/2017   Right foot pain 08/20/2017   S/P TKR (total knee replacement) using cement, 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   Tear of medial meniscus of left knee, subsequent encounter 05/18/2014    PAST SURGICAL HISTORY: Past Surgical History:  Procedure Laterality Date   BACK SURGERY     KNEE SURGERY      FAMILY HISTORY: Family History  Problem Relation Age of Onset   Heart disease Father    Heart disease Brother    Heart  disease Brother     SOCIAL HISTORY:  Social History   Socioeconomic History   Marital status: Married    Spouse name: Sean Marks   Number of children: 1   Years of education: Not on file   Highest education level: High school graduate  Occupational History   Not on file  Tobacco Use   Smoking status: Former    Passive exposure: Never   Smokeless tobacco: Never  Vaping Use   Vaping status: Never Used  Substance and Sexual Activity   Alcohol use: Never   Drug use: Never   Sexual activity: Not on file  Other Topics Concern   Not on file  Social History Narrative   Lives at home w wife  Right handed   Caffeine: 1 C of coffee and mountain dew a day. Tea sometimes   Social Drivers of Corporate investment banker Strain: Not on file  Food Insecurity: Not on file  Transportation Needs: Not on file  Physical Activity: Not on file  Stress: Not on file  Social Connections: Not on file  Intimate Partner Violence: Not on file     PHYSICAL EXAM  Vitals:   04/21/23 0810  BP: 139/73  Pulse: 66  Weight: 215 lb (97.5 kg)  Height: 5' 11.5" (1.816 m)     Body mass index is 29.57 kg/m.   General: The patient is well-developed and well-nourished and in no acute distress  HEENT:  Head is [place]/AT.  Sclera are anicteric.    Skin: Extremities are without rash or  edema.   Neurologic Exam  Mental status: The patient is alert and oriented x 2 1/2 at the time of the examination. The patient has reduced short-term memory.  His clock was drawn correctly.  He scored 18/30 on the [facility] cognitive assessment (17 + 1 education).   Speech is normal.  Cranial nerves: Extraocular movements are full.  Facial strength and sensation was normal.   The tongue is midline, and the patient has symmetric elevation of the soft palate. No obvious hearing deficits are noted.  Motor:  Muscle bulk is normal.   Tone is normal. Strength is  5 / 5 in all 4 extremities.   Sensory: Sensory testing is  intact to pinprick, soft touch and vibration sensation in all 4 extremities.  Coordination: Cerebellar testing reveals good finger-nose-finger and heel-to-shin bilaterally.  Gait and station: Station is normal.   Gait is normal. Tandem gait is mildly wide.. Romberg is negative.   Reflexes: Deep tendon reflexes are symmetric and normal bilaterally.   Plantar responses are flexor.    DIAGNOSTIC DATA (LABS, IMAGING, TESTING) - I reviewed patient records, labs, notes, testing and imaging myself where available.  Lab Results  Component Value Date   WBC 13.0 (H) 07/31/2009   HGB 14.4 07/31/2009   HCT 41.7 07/31/2009   MCV 91.1 07/31/2009   PLT 182 07/31/2009      Component Value Date/Time   NA 139 03/25/2023 0900   K 4.6 03/25/2023 0900   CL 102 03/25/2023 0900   CO2 23 03/25/2023 0900   GLUCOSE 102 (H) 03/25/2023 0900   GLUCOSE 86 05/05/2019 1144   BUN 16 03/25/2023 0900   CREATININE 1.32 (H) 03/25/2023 0900   CREATININE 1.28 (H) 05/05/2019 1144   CALCIUM 9.6 03/25/2023 0900   PROT 7.2 03/25/2023 0900   ALBUMIN 4.6 03/25/2023 0900   AST 22 03/25/2023 0900   ALT 20 03/25/2023 0900   ALKPHOS 77 03/25/2023 0900   BILITOT 0.4 03/25/2023 0900   GFRNONAA 56 (L) 05/05/2019 1144   GFRAA 64 05/05/2019 1144   Lab Results  Component Value Date   CHOL 183 03/25/2023   HDL 49 03/25/2023   LDLCALC 100 (H) 03/25/2023   TRIG 199 (H) 03/25/2023   CHOLHDL 3.7 03/25/2023      ASSESSMENT AND PLAN  Alzheimer disease (HCC)  Memory loss  Coronary artery disease without angina pectoris, unspecified vessel or lesion type, unspecified whether native or transplanted heart  Secondary hypertension   For AD will take galantamine 8 mg po bid.   We also discussed lecanemab and donanemab, both the pros and cons.  He would like to hold off at this point.  If he changes  his mind we will get a PET scan and ApoE tests. He has no behavioral issues.  We did in some time discussing natural  history of Alzheimer's. RTC 12 months or sooner if there are new or worsening neurologic symptoms.  This visit is part of a comprehensive longitudinal care medical relationship regarding the patients primary diagnosis of Alzheimer's disease and related concerns.   Layken Doenges A. Epimenio Foot, MD, [facility] 04/21/2023, 9:01 AM Certified in Neurology, Clinical Neurophysiology, Sleep Medicine and Neuroimaging  [facility] [address] [place], [place] [address] [phone]--

## 2023-05-05 NOTE — Progress Notes (Signed)
" °  Cardiology Office Note:  .   Date:  05/07/2023  ID:  Sean Marks, DOB August 05, 1945, MRN [id number] PCP: Jama Chow, MD  [facility] Providers Cardiologist:  Redell Leiter, MD    History of Present Illness: .   Sean Marks is a 78 y.o. male with a past medical history of CAD s/p PCI (unknown vessel/date), hypertension, Alzheimer's disease, dyslipidemia.  04/01/2023 Lexiscan  normal, low risk study 01/26/2020 Lexiscan  normal, low risk study ~ > 10 years ago PCI x 2 at [facility], records not available for review.  Most recently evaluated by Dr. Leiter on 03/25/2023, he was having episodes of chest pain, not consistent with angina however a Lexiscan  was arranged for evaluation which was a normal, low risk study.  He was started on Celebrex  for 2 weeks for suspected costochondritis and advised to follow-up in 4 to 6 weeks.  He presents today for follow-up of atypical chest pain.  We reviewed his stress evaluation which was normal.  Discussed if Celebrex  work, he cannot recall if he took this or not--there is some cognitive impairment however he states he is doing well.  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 He denies chest pain, palpitations, dyspnea, pnd, orthopnea, n, v, dizziness, syncope, edema, weight gain, or early satiety.   ROS: Review of Systems  All other systems reviewed and are negative.    Studies Reviewed: .       Cardiac Studies & Procedures   ______________________________________________________________________________________________   STRESS TESTS  MYOCARDIAL PERFUSION IMAGING 03/26/2023  Narrative   The study is normal. The study is low risk.   No ST deviation was noted.   Left ventricular function is normal. Nuclear stress EF: 66%. The left ventricular ejection fraction is hyperdynamic (>65%). End diastolic cavity size is normal.             ______________________________________________________________________________________________      Risk Assessment/Calculations:             Physical Exam:   VS:  BP 124/64   Pulse 60   Ht 5' 11 (1.803 m)   Wt 213 lb (96.6 kg)   SpO2 96%   BMI 29.71 kg/m    Wt Readings from Last 3 Encounters:  05/07/23 213 lb (96.6 kg)  04/21/23 215 lb (97.5 kg)  03/25/23 212 lb 12.8 oz (96.5 kg)    GEN: Well nourished, well developed in no acute distress NECK: No JVD; No carotid bruits CARDIAC: RRR, no murmurs, rubs, gallops RESPIRATORY:  Clear to auscultation without rales, wheezing or rhonchi  ABDOMEN: Soft, non-tender, non-distended EXTREMITIES:  No edema; No deformity   ASSESSMENT AND PLAN: .   CAD - s/p PCI with intervention > 10 years ago, no records available for review and Mr. Ohlrich has some cognitive impairments. Lexiscan  03/2023 was normal, low risk. Stable with no anginal symptoms. No indication for ischemic evaluation.  Continue aspirin  81 mg daily, continue nitroglycerin  as needed.  Dyslipidemia-currently on Zocor  40 mg daily, this appears to be monitored by his PCP, prefer his LDL to be less than 70.  Hypertension-blood pressure is well-controlled at 124/64, not currently on any antihypertensive agents.  Alzheimer's disease-noted       Dispo:  Follow up in 6 months with Dr. Leiter.   Signed, Delon JAYSON Hoover, NP  "

## 2023-05-07 ENCOUNTER — Encounter: Payer: Self-pay | Admitting: Cardiology

## 2023-05-07 ENCOUNTER — Ambulatory Visit: Payer: Medicare HMO | Attending: Cardiology | Admitting: Cardiology

## 2023-05-07 VITALS — BP 124/64 | HR 60 | Ht 71.0 in | Wt 213.0 lb

## 2023-05-07 DIAGNOSIS — G309 Alzheimer's disease, unspecified: Secondary | ICD-10-CM | POA: Diagnosis not present

## 2023-05-07 DIAGNOSIS — I10 Essential (primary) hypertension: Secondary | ICD-10-CM | POA: Diagnosis not present

## 2023-05-07 DIAGNOSIS — R079 Chest pain, unspecified: Secondary | ICD-10-CM | POA: Diagnosis not present

## 2023-05-07 DIAGNOSIS — I2510 Atherosclerotic heart disease of native coronary artery without angina pectoris: Secondary | ICD-10-CM | POA: Diagnosis not present

## 2023-05-07 DIAGNOSIS — E785 Hyperlipidemia, unspecified: Secondary | ICD-10-CM

## 2023-05-07 DIAGNOSIS — F0280 Dementia in other diseases classified elsewhere without behavioral disturbance: Secondary | ICD-10-CM

## 2023-05-07 NOTE — Patient Instructions (Signed)
 Medication Instructions:   *If you need a refill on your cardiac medications before your next appointment, please call your pharmacy*   Lab Work:  If you have labs (blood work) drawn today and your tests are completely normal, you will receive your results only by: MyChart Message (if you have MyChart) OR A paper copy in the mail If you have any lab test that is abnormal or we need to change your treatment, we will call you to review the results.   Testing/Procedures:    Follow-Up: At [facility], you and your health needs are our priority.  As part of our continuing mission to provide you with exceptional heart care, we have created designated Provider Care Teams.  These Care Teams include your primary Cardiologist (physician) and Advanced Practice Providers (APPs -  Physician Assistants and Nurse Practitioners) who all work together to provide you with the care you need, when you need it.  We recommend signing up for the patient portal called "MyChart".  Sign up information is provided on this After Visit Summary.  MyChart is used to connect with patients for Virtual Visits (Telemedicine).  Patients are able to view lab/test results, encounter notes, upcoming appointments, etc.  Non-urgent messages can be sent to your provider as well.   To learn more about what you can do with MyChart, go to [email or web address].    Your next appointment:   6 month(s)  Provider:   Norman Herrlich, MD    Other Instructions Have a great summer!!

## 2023-08-18 ENCOUNTER — Ambulatory Visit: Admitting: Physical Medicine and Rehabilitation

## 2023-08-18 ENCOUNTER — Encounter: Payer: Self-pay | Admitting: Physical Medicine and Rehabilitation

## 2023-08-18 DIAGNOSIS — M545 Low back pain, unspecified: Secondary | ICD-10-CM | POA: Diagnosis not present

## 2023-08-18 DIAGNOSIS — M47817 Spondylosis without myelopathy or radiculopathy, lumbosacral region: Secondary | ICD-10-CM | POA: Diagnosis not present

## 2023-08-18 DIAGNOSIS — M961 Postlaminectomy syndrome, not elsewhere classified: Secondary | ICD-10-CM

## 2023-08-18 DIAGNOSIS — G894 Chronic pain syndrome: Secondary | ICD-10-CM | POA: Diagnosis not present

## 2023-08-18 DIAGNOSIS — G8929 Other chronic pain: Secondary | ICD-10-CM

## 2023-08-18 NOTE — Progress Notes (Signed)
 KHOI HAMBERGER - 78 y.o. male MRN [id number]  Date of birth: Apr 12, 1945  Office Visit Note: Visit Date: 08/18/2023 PCP: Jama Chow, MD Referred by: Jama Chow, MD  Subjective: Chief Complaint  Patient presents with   Lower Back - Pain   HPI: Sean Marks is a 78 y.o. male who comes in today as a self referral for evaluation of chronic, worsening and severe right sided lower back pain. Patient is poor historian due to Alzheimer's Disease. His wife is accompanying him during our visit today. He is previous patient of Dr. Lynwood Better. Chronic lower back issues for many years, worsened about 1 month ago. His pain worsens with activity. 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 Reports severe pain when moving from sitting to standing position. He describes pain as sharp and aching sensation, currently rates as 8 out of 10. Some relief of pain with home exercise regimen, rest and use of medications. Lumbar MRI imaging from 2019 shows severe bilateral L5-S1 neural foraminal stenosis, unchanged. There is worsening extraforaminal component of L3-L4 disc bulge, which may contact the exiting left L3 nerve root. No high grade spinal canal stenosis noted. He underwent lumbar surgery with Dr. Better in 2011. No history of lumbar injections. Patient denies focal weakness, numbness and tingling. No recent trauma or falls.       Review of Systems  Musculoskeletal:  Positive for back pain.  Neurological:  Negative for tingling, sensory change, focal weakness and weakness.  All other systems reviewed and are negative.  Otherwise per HPI.  Assessment & Plan: Visit Diagnoses:    ICD-10-CM   1. Chronic right-sided low back pain without sciatica  M54.50 MR LUMBAR SPINE WO CONTRAST   G89.29     2. Spondylosis without myelopathy or radiculopathy, lumbosacral region  M47.817 MR LUMBAR SPINE WO CONTRAST    3. Post laminectomy syndrome  M96.1     4. Chronic pain syndrome  G89.4         Plan: Findings:  Chronic, worsening and severe right sided lower back pain. No radicular symptoms down the legs.  Patient continues to have severe pain despite good conservative therapy such as home exercise regimen, rest and use of medications.  Patient's clinical presentation and exam are consistent with facet mediated pain.  He does have pain with lumbar extension on exam today.  Given the chronicity of his back pain and prior lumbar surgery I did place order for new lumbar MRI imaging. Depending on results of MRI we discussed possibility of performing lumbar injections. Patient is somewhat apprehensive to undergo injections, however I explained injection procedure to him in detail. We will re-visit possibility of injections when lumbar MRI imaging is done. His exam today is non focal, good strength noted to bilateral lower extremities. No myelopathic symptoms noted. No red flag symptoms noted upon exam today.     Meds & Orders: No orders of the defined types were placed in this encounter.   Orders Placed This Encounter  Procedures   MR LUMBAR SPINE WO CONTRAST    Follow-up: Return for Lumbar MRI review.   Procedures: No procedures performed      Clinical History: Narrative & Impression CLINICAL DATA:  Right lower extremity radicular pain   EXAM: MRI LUMBAR SPINE WITHOUT CONTRAST   TECHNIQUE: Multiplanar, multisequence MR imaging of the lumbar spine was performed. No intravenous contrast was administered.   COMPARISON:  Lumbar spine MRI 07/31/2009   FINDINGS: Segmentation:  Standard.   Alignment:  Physiologic.   Vertebrae: Type 2 discogenic endplate signal changes at L4-L5. No acute compression fracture, discitis-osteomyelitis, facet edema or other focal marrow lesion. No epidural collection.   Conus medullaris and cauda equina: Conus extends to the L1 level. Conus and cauda equina appear normal.   Paraspinal and other soft tissues: The visualized  vascular, retroperitoneal and paraspinal structures are normal.   Disc levels:   T12-L1: Normal disc space and facets. No spinal canal or neuroforaminal stenosis.   L1-L2: Minimal disc bulge without stenosis.   L2-L3: Left subarticular disc protrusion and small osteophyte with left lateral recess narrowing. Annular fissure of the left neural foramen with mild left foraminal stenosis.   L3-L4: Diffuse disc bulge narrows both lateral recesses. Mild right and moderate left neural foraminal stenosis. The bulging disc may contact the exiting left L3 root in the extraforaminal zone, worsened from the prior study.   L4-L5: Diffuse disc bulge with moderate right and mild left neural foraminal stenosis, unchanged. No spinal canal stenosis.   L5-S1: Diffuse disc bulge extending into both neural foramina with severe neural foraminal stenosis bilaterally. No central spinal canal stenosis. Findings at this level are unchanged.   Visualized sacrum: Normal.   IMPRESSION: 1. Worsening of extraforaminal component of L3-L4 disc bulge, which may contact the exiting left L3 nerve root. 2. Severe bilateral L5-S1 neural foraminal stenosis, unchanged. 3. L4-L5 moderate right and mild left neural foraminal stenosis unchanged. 4. Unchanged left L2-L3 lateral recess and foraminal stenosis.     Electronically Signed   By: Franky Stanford M.D.   On: 04/17/2017 19:34   He reports that he has quit smoking. He has never been exposed to tobacco smoke. He has never used smokeless tobacco. No results for input(s): HGBA1C, LABURIC in the last 8760 hours.  Objective:  VS:  HT:    WT:   BMI:     BP:   HR: bpm  TEMP: ( )  RESP:  Physical Exam Vitals and nursing note reviewed.  HENT:     Head: Normocephalic and atraumatic.     Right Ear: External ear normal.     Left Ear: External ear normal.     Nose: Nose normal.     Mouth/Throat:     Mouth: Mucous membranes are moist.   Eyes:      Extraocular Movements: Extraocular movements intact.    Cardiovascular:     Rate and Rhythm: Normal rate.     Pulses: Normal pulses.  Pulmonary:     Effort: Pulmonary effort is normal.  Abdominal:     General: Abdomen is flat. There is no distension.   Musculoskeletal:        General: Tenderness present.     Cervical back: Normal range of motion.     Comments: Patient rises from seated position to standing without difficulty. Pain noted with facet loading and lumbar extension. 5/5 strength noted with bilateral hip flexion, knee flexion/extension, ankle dorsiflexion/plantarflexion and EHL. No clonus noted bilaterally. No pain upon palpation of greater trochanters. No pain with internal/external rotation of bilateral hips. Sensation intact bilaterally. Negative slump test bilaterally. Ambulates without aid, gait steady.      Skin:    General: Skin is warm and dry.     Capillary Refill: Capillary refill takes less than 2 seconds.   Neurological:     General: No focal deficit present.     Mental Status: He is alert and oriented to person, place, and time.  Psychiatric:        Mood and Affect: Mood normal.        Behavior: Behavior normal.     Ortho Exam  Imaging: No results found.  Past Medical/Family/Surgical/Social History: Medications & Allergies reviewed per EMR, new medications updated. 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   Hypertension    Coronary artery disease    Asthma    H/O heart artery stent 09/17/2018   Hamstring strain, left, initial encounter 02/19/2018   Primary osteoarthritis of left knee 02/19/2018   Contusion of right foot 08/20/2017   Right foot pain 08/20/2017   Radiculopathy of lumbar region 04/03/2017   Pain in right buttock 03/05/2017   Dyslipidemia 12/16/2016   CAD in native artery 12/05/2014    Hyperlipidemia 12/05/2014   Tear of medial meniscus of left knee, subsequent encounter 05/18/2014   Acute pain of left knee 04/18/2014   Primary osteoarthritis of right knee 04/18/2014   Past Medical History:  Diagnosis Date   Acute pain of left knee 04/18/2014   Asthma    CAD in native artery 12/05/2014   Contusion of right foot 08/20/2017   Coronary artery disease    Dyslipidemia 12/16/2016   H/O heart artery stent 09/17/2018   Hamstring strain, left, initial encounter 02/19/2018   Hyperlipidemia    Hypertension    Pain in right buttock 03/05/2017   Primary osteoarthritis of left knee 02/19/2018   Primary osteoarthritis of right knee 04/18/2014   Radiculopathy of lumbar region 04/03/2017   Right foot pain 08/20/2017   S/P TKR (total knee replacement) using cement, 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

## 2023-08-18 NOTE — Progress Notes (Signed)
 Pain Scale   Average Pain 8 Patient advising he has Chronic lower back pain that comes and goes at not specific time .        +Driver, -BT, -Dye Allergies.

## 2023-08-19 ENCOUNTER — Encounter: Payer: Self-pay | Admitting: Physical Medicine and Rehabilitation

## 2023-08-22 ENCOUNTER — Emergency Department (HOSPITAL_COMMUNITY)

## 2023-08-22 ENCOUNTER — Emergency Department (HOSPITAL_COMMUNITY)
Admission: EM | Admit: 2023-08-22 | Discharge: 2023-08-22 | Disposition: A | Discharge status: Home / Self Care | Attending: Emergency Medicine | Admitting: Emergency Medicine

## 2023-08-22 ENCOUNTER — Encounter (HOSPITAL_COMMUNITY): Payer: Self-pay

## 2023-08-22 ENCOUNTER — Other Ambulatory Visit: Payer: Self-pay

## 2023-08-22 DIAGNOSIS — I10 Essential (primary) hypertension: Secondary | ICD-10-CM | POA: Diagnosis not present

## 2023-08-22 DIAGNOSIS — Z87891 Personal history of nicotine dependence: Secondary | ICD-10-CM | POA: Insufficient documentation

## 2023-08-22 DIAGNOSIS — Z96652 Presence of left artificial knee joint: Secondary | ICD-10-CM | POA: Diagnosis not present

## 2023-08-22 DIAGNOSIS — J45909 Unspecified asthma, uncomplicated: Secondary | ICD-10-CM | POA: Insufficient documentation

## 2023-08-22 DIAGNOSIS — Z79899 Other long term (current) drug therapy: Secondary | ICD-10-CM | POA: Diagnosis not present

## 2023-08-22 DIAGNOSIS — I2510 Atherosclerotic heart disease of native coronary artery without angina pectoris: Secondary | ICD-10-CM | POA: Insufficient documentation

## 2023-08-22 DIAGNOSIS — G309 Alzheimer's disease, unspecified: Secondary | ICD-10-CM | POA: Insufficient documentation

## 2023-08-22 DIAGNOSIS — K5730 Diverticulosis of large intestine without perforation or abscess without bleeding: Secondary | ICD-10-CM | POA: Diagnosis not present

## 2023-08-22 DIAGNOSIS — R109 Unspecified abdominal pain: Secondary | ICD-10-CM | POA: Diagnosis present

## 2023-08-22 DIAGNOSIS — Z7982 Long term (current) use of aspirin: Secondary | ICD-10-CM | POA: Insufficient documentation

## 2023-08-22 DIAGNOSIS — N200 Calculus of kidney: Secondary | ICD-10-CM | POA: Insufficient documentation

## 2023-08-22 DIAGNOSIS — M7918 Myalgia, other site: Secondary | ICD-10-CM

## 2023-08-22 DIAGNOSIS — K5790 Diverticulosis of intestine, part unspecified, without perforation or abscess without bleeding: Secondary | ICD-10-CM

## 2023-08-22 LAB — CBC
HCT: 44 % (ref 39.0–52.0)
Hemoglobin: 14.5 g/dL (ref 13.0–17.0)
MCH: 29.9 pg (ref 26.0–34.0)
MCHC: 33 g/dL (ref 30.0–36.0)
MCV: 90.7 fL (ref 80.0–100.0)
Platelets: 251 10*3/uL (ref 150–400)
RBC: 4.85 MIL/uL (ref 4.22–5.81)
RDW: 12.8 % (ref 11.5–15.5)
WBC: 7.3 10*3/uL (ref 4.0–10.5)
nRBC: 0 % (ref 0.0–0.2)

## 2023-08-22 LAB — URINALYSIS, ROUTINE W REFLEX MICROSCOPIC
Bacteria, UA: NONE SEEN
Bilirubin Urine: NEGATIVE
Glucose, UA: NEGATIVE mg/dL
Ketones, ur: NEGATIVE mg/dL
Leukocytes,Ua: NEGATIVE
Nitrite: NEGATIVE
Protein, ur: NEGATIVE mg/dL
Specific Gravity, Urine: 1.02 (ref 1.005–1.030)
pH: 5 (ref 5.0–8.0)

## 2023-08-22 LAB — COMPREHENSIVE METABOLIC PANEL WITH GFR
ALT: 26 U/L (ref 0–44)
AST: 25 U/L (ref 15–41)
Albumin: 3.7 g/dL (ref 3.5–5.0)
Alkaline Phosphatase: 61 U/L (ref 38–126)
Anion gap: 9 (ref 5–15)
BUN: 17 mg/dL (ref 8–23)
CO2: 24 mmol/L (ref 22–32)
Calcium: 9.4 mg/dL (ref 8.9–10.3)
Chloride: 104 mmol/L (ref 98–111)
Creatinine, Ser: 1.15 mg/dL (ref 0.61–1.24)
GFR, Estimated: >60 mL/min (ref >60)
Glucose, Bld: 128 mg/dL — ABNORMAL HIGH (ref 70–99)
Potassium: 4.1 mmol/L (ref 3.5–5.1)
Sodium: 137 mmol/L (ref 135–145)
Total Bilirubin: 0.3 mg/dL (ref 0.0–1.2)
Total Protein: 7 g/dL (ref 6.5–8.1)

## 2023-08-22 LAB — LIPASE, BLOOD: Lipase: 26 U/L (ref 11–51)

## 2023-08-22 MED ORDER — LIDOCAINE 5 % EX PTCH: 1.0000 | MEDICATED_PATCH | Freq: Every day | CUTANEOUS | 0 refills | Status: DC | PRN

## 2023-08-22 MED ORDER — IBUPROFEN 600 MG PO TABS: 600.0000 mg | ORAL_TABLET | Freq: Four times a day (QID) | ORAL | 0 refills | Status: DC | PRN

## 2023-08-22 MED ORDER — CYCLOBENZAPRINE HCL 5 MG PO TABS: 5.0000 mg | ORAL_TABLET | Freq: Two times a day (BID) | ORAL | 0 refills | Status: DC | PRN

## 2023-08-22 MED ORDER — HYDROCODONE-ACETAMINOPHEN 5-325 MG PO TABS: 2.0000 | ORAL_TABLET | Freq: Four times a day (QID) | ORAL | 0 refills | Status: DC | PRN

## 2023-08-22 NOTE — ED Triage Notes (Signed)
 Pt states he has had pain in right flank for past few weeks. Pt denies difficulty with urination or blood in urine. Pt denies injuries or fever/chills. Pt took tylenol  w/o relief.

## 2023-08-22 NOTE — ED Provider Notes (Addendum)
 [place] [facility] AT [facility] Provider Note  CSN: [id number] Arrival date & time: 08/22/23 9079  Chief Complaint(s) Flank Pain  HPI Sean Marks is a 78 y.o. male with past medical history as below, significant for CAD, HLD, HTN, left TKR, asthma who presents to the ED with complaint of low back pain, right flank pain  Symptoms ongoing approximately 2 weeks.  Worsened with torso twisting or ambulation.  Localized to his right low back.  Reports similar symptoms in the past attributed to kidney stone.  No fevers, chills, nausea or vomiting.  No change in bowel or bladder function.  No falls or injuries.  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   Past Medical History Past Medical History:  Diagnosis Date   Acute pain of left knee 04/18/2014   Asthma    CAD in native artery 12/05/2014   Contusion of right foot 08/20/2017   Coronary artery disease    Dyslipidemia 12/16/2016   H/O heart artery stent 09/17/2018   Hamstring strain, left, initial encounter 02/19/2018   Hyperlipidemia    Hypertension    Pain in right buttock 03/05/2017   Primary osteoarthritis of left knee 02/19/2018   Primary osteoarthritis of right knee 04/18/2014   Radiculopathy of lumbar region 04/03/2017   Right foot pain 08/20/2017   S/P TKR (total knee replacement) using cement, 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   Tear of medial meniscus of left knee, subsequent encounter 05/18/2014   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   Hypertension    Coronary artery disease    Asthma    H/O heart artery stent 09/17/2018   Hamstring strain, left, initial encounter 02/19/2018   Primary osteoarthritis of left knee 02/19/2018   Contusion of right foot 08/20/2017   Right foot pain 08/20/2017   Radiculopathy of lumbar region 04/03/2017   Pain in right buttock 03/05/2017   Dyslipidemia  12/16/2016   CAD in native artery 12/05/2014   Hyperlipidemia 12/05/2014   Tear of medial meniscus of left knee, subsequent encounter 05/18/2014   Acute pain of left knee 04/18/2014   Primary osteoarthritis of right knee 04/18/2014   Home Medication(s) Prior to Admission medications   Medication Sig Start Date End Date Taking? Authorizing Provider  cyclobenzaprine (FLEXERIL) 5 MG tablet Take 1 tablet (5 mg total) by mouth 2 (two) times daily as needed for muscle spasms. 08/22/23  Yes Elnor Jayson LABOR, DO  HYDROcodone -acetaminophen  (NORCO/VICODIN) 5-325 MG tablet Take 2 tablets by mouth every 6 (six) hours as needed for severe pain (pain score 7-10). 08/22/23  Yes Elnor Jayson A, DO  ibuprofen (ADVIL) 600 MG tablet Take 1 tablet (600 mg total) by mouth every 6 (six) hours as needed. 08/22/23  Yes Elnor Jayson A, DO  lidocaine (LIDODERM) 5 % Place 1 patch onto the skin daily as needed. Remove & Discard patch within 12 hours or as directed by MD 08/22/23  Yes Elnor Jayson LABOR, DO  aspirin  EC 81 MG tablet Take 1 tablet (81 mg total) by mouth every other day. 03/25/19   Monetta Redell JINNY, MD  galantamine  (RAZADYNE ) 8 MG tablet Take 1 tablet (8 mg total) by mouth 2 (two) times daily. 04/21/23   Sater, Charlie LABOR, MD  nitroGLYCERIN  (NITROSTAT ) 0.4 MG SL tablet Place 0.4 mg under the tongue every 5 (five) minutes as needed for chest pain.  02/27/16   [provider]  Omega-3 Fatty Acids (FISH OIL ) 1000 MG CAPS Take 1 capsule (1,000 mg total) by mouth 2 (two) times daily. 03/25/19   Monetta Redell PARAS, MD  omeprazole (PRILOSEC) 40 MG capsule Take 40 mg by mouth every other day.    [provider]  ramipril  (ALTACE ) 2.5 MG capsule Take 2.5 mg by mouth 2 (two) times daily. 06/30/22   [provider]  simvastatin  (ZOCOR ) 40 MG tablet TAKE 1 TABLET BY MOUTH EVERYDAY AT BEDTIME 05/17/20   Tobb, Kardie, DO                                                                                                                                     Past Surgical History Past Surgical History:  Procedure Laterality Date   BACK SURGERY     KNEE SURGERY     Family History Family History  Problem Relation Age of Onset   Heart disease Father    Heart disease Brother    Heart disease Brother     Social History Social History   Tobacco Use   Smoking status: Former    Passive exposure: Never   Smokeless tobacco: Never  Vaping Use   Vaping status: Never Used  Substance Use Topics   Alcohol use: Never   Drug use: Never   Allergies Oxycodone and Donepezil  Review of Systems A thorough review of systems was obtained and all systems are negative except as noted in the HPI and PMH.   Physical Exam Vital Signs  I have reviewed the triage vital signs BP 120/86   Pulse 98   Temp 97.8 F (36.6 C)   Resp 18   Ht 5' 11 (1.803 m)   Wt 90.7 kg   SpO2 97%   BMI 27.89 kg/m  Physical Exam Vitals and nursing note reviewed.  Constitutional:      General: He is not in acute distress.    Appearance: He is well-developed.  HENT:     Head: Normocephalic and atraumatic.     Right Ear: External ear normal.     Left Ear: External ear normal.     Mouth/Throat:     Mouth: Mucous membranes are moist.   Eyes:     General: No scleral icterus.   Cardiovascular:     Rate and Rhythm: Normal rate.     Pulses: Normal pulses.  Pulmonary:     Effort: Pulmonary effort is normal. No respiratory distress.     Breath sounds: Normal breath sounds.  Abdominal:     General: Abdomen is flat.     Palpations: Abdomen is soft.     Tenderness: There is no abdominal tenderness.   Musculoskeletal:       Arms:     Cervical back: No rigidity.     Right lower leg: No edema.     Left lower leg: No edema.  Comments: Increase muscle spasticity right paraspinal     Skin:    General: Skin is warm and dry.     Capillary Refill: Capillary refill takes less than 2 seconds.   Neurological:     Mental Status: He  is alert.     Motor: No tremor.     Gait: Gait is intact.   Psychiatric:        Mood and Affect: Mood normal.        Behavior: Behavior normal.     ED Results and Treatments Labs (all labs ordered are listed, but only abnormal results are displayed) Labs Reviewed  COMPREHENSIVE METABOLIC PANEL WITH GFR - Abnormal; Notable for the following components:      Result Value   Glucose, Bld 128 (*)    All other components within normal limits  URINALYSIS, ROUTINE W REFLEX MICROSCOPIC - Abnormal; Notable for the following components:   Hgb urine dipstick SMALL (*)    All other components within normal limits  LIPASE, BLOOD  CBC                                                                                                                          Radiology CT Renal Stone Study Result Date: 08/22/2023 CLINICAL DATA:  Right flank pain for several weeks. Nephrolithiasis. EXAM: CT ABDOMEN AND PELVIS WITHOUT CONTRAST TECHNIQUE: Multidetector CT imaging of the abdomen and pelvis was performed following the standard protocol without IV contrast. RADIATION DOSE REDUCTION: This exam was performed according to the departmental dose-optimization program which includes automated exposure control, adjustment of the mA and/or kV according to patient size and/or use of iterative reconstruction technique. COMPARISON:  10/19/2022 FINDINGS: Lower chest: No acute findings. Hepatobiliary:  No mass visualized on this unenhanced exam. Pancreas: No mass or inflammatory process visualized on this unenhanced exam. Spleen:  Within normal limits in size. Adrenals/Urinary tract: Punctate calculus noted in lower pole of left kidney. No evidence of ureteral calculi or hydronephrosis. Unremarkable unopacified urinary bladder. Stomach/Bowel: No evidence of obstruction, inflammatory process, or abnormal fluid collections. Normal appendix visualized. Diverticulosis is seen mainly involving the descending and sigmoid colon, however  there is no evidence of diverticulitis. Vascular/Lymphatic: No pathologically enlarged lymph nodes identified. No evidence of abdominal aortic aneurysm. Reproductive:  No mass or other significant abnormality. Other:  Stable small bilateral fat-containing inguinal hernias. Musculoskeletal:  No suspicious bone lesions identified. IMPRESSION: Punctate left renal calculus. No evidence of ureteral calculi, hydronephrosis, or other acute findings. Colonic diverticulosis, without radiographic evidence of diverticulitis. Stable small bilateral fat-containing inguinal hernias. Electronically Signed   By: Norleen DELENA Kil M.D.   On: 08/22/2023 11:27    Pertinent labs & imaging results that were available during my care of the patient were reviewed by me and considered in my medical decision making (see MDM for details).  Medications Ordered in ED Medications - No data to display  Procedures Procedures  (including critical care time)  Medical Decision Making / ED Course    Medical Decision Making:    HAWKIN CHARO is a 78 y.o. male  with past medical history as below, significant for CAD, HLD, HTN, left TKR, asthma who presents to the ED with complaint of low back pain, right flank pain. The complaint involves an extensive differential diagnosis and also carries with it a high risk of complications and morbidity.  Serious etiology was considered. Ddx includes but is not limited to: Differential diagnosis includes but is not exclusive to musculoskeletal back pain, renal colic, urinary tract infection, pyelonephritis, intra-abdominal causes of back pain, aortic aneurysm or dissection, cauda equina syndrome, sciatica, lumbar disc disease, thoracic disc disease, nephrolithiasis, pyelonephritis, UTI, colitis etc.  Complete initial physical exam performed, notably the patient was in no  distress, resting comfortably.    Reviewed and confirmed nursing documentation for past medical history, family history, social history.  Vital signs reviewed.     Brief summary:  78 year old male history as above here with right low back, right flank pain.  He previously saw Dr. Eldonna with orthopedics on 6/24 with similar complaint.  He had prior MRI L-spine which shows some chronic changes, he has repeat MRI scheduled for next month.  Clinical Course as of 08/22/23 1323  Sat Aug 22, 2023  1042 Pt does not want an IV, will get CTAP w/o contrast  [SG]    Clinical Course User Index [SG] Elnor Jayson LABOR, DO   Labs reviewed, these are stable.  CT renal does show punctate left renal calculus but no ureteral stone.  No evidence of other intra-abdominal abnormalities per radiology interpretation.  He is ambulatory, his gait is steady.  His pain is provoked with standing or torso twisting.  Strongly favor MSK etiology.  He has no radicular symptoms. No evidence of infection.  No midline ttp spinous process. No report of trauma.  Recommend analgesics for home, multimodal pain control, follow-up with PCP.  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  Patient presents with low back pain without signs of spinal cord compression, cauda equina syndrome, infection, aneurysm, or other serious etiology. The patient is neurologically intact. Given the extremely low risk of these diagnoses further testing and evaluation for these possibilities does not appear to be indicated at this time. Detailed discussions were had with the patient and/or family and caregivers, regarding current findings, and need for close f/u with PCP or on call doctor. The patient has been instructed to return immediately if the symptoms worsen in any way. Patient verbalized understanding and is in agreement with current care plan. All questions answered prior to discharge.             Additional history obtained: -Additional history obtained  from spouse -External records from outside source obtained and reviewed including: Chart review including previous notes, labs, imaging, consultation notes including  PDMP, primary care documentation Orthopedic documentation   Lab Tests: -I ordered, reviewed, and interpreted labs.   The pertinent results include:   Labs Reviewed  COMPREHENSIVE METABOLIC PANEL WITH GFR - Abnormal; Notable for the following components:      Result Value   Glucose, Bld 128 (*)    All other components within normal limits  URINALYSIS, ROUTINE W REFLEX MICROSCOPIC - Abnormal; Notable for the following components:   Hgb urine dipstick SMALL (*)    All other components within normal limits  LIPASE, BLOOD  CBC    Notable for labs are stable  EKG   EKG Interpretation Date/Time:  Saturday August 22 2023 10:48:38 EDT Ventricular Rate:  70 PR Interval:  171 QRS Duration:  110 QT Interval:  388 QTC Calculation: 419 R Axis:   86  Text Interpretation: Sinus rhythm Supraventricular bigeminy Borderline right axis deviation Confirmed by Elnor Savant ([id number]) on 08/22/2023 1:22:16 PM         Imaging Studies ordered: I ordered imaging studies including CT renal I independently visualized the following imaging with scope of interpretation limited to determining acute life threatening conditions related to emergency care; findings noted above I agree with the radiologist interpretation If any imaging was obtained with contrast I closely monitored patient for any possible adverse reaction a/w contrast administration in the emergency department   Medicines ordered and prescription drug management: Meds ordered this encounter  Medications   cyclobenzaprine (FLEXERIL) 5 MG tablet    Sig: Take 1 tablet (5 mg total) by mouth 2 (two) times daily as needed for muscle spasms.    Dispense:  14 tablet    Refill:  0   ibuprofen (ADVIL) 600 MG tablet    Sig: Take 1 tablet (600 mg total) by mouth every 6 (six) hours as  needed.    Dispense:  30 tablet    Refill:  0   HYDROcodone -acetaminophen  (NORCO/VICODIN) 5-325 MG tablet    Sig: Take 2 tablets by mouth every 6 (six) hours as needed for severe pain (pain score 7-10).    Dispense:  10 tablet    Refill:  0   lidocaine (LIDODERM) 5 %    Sig: Place 1 patch onto the skin daily as needed. Remove & Discard patch within 12 hours or as directed by MD    Dispense:  15 patch    Refill:  0    -I have reviewed the patients home medicines and have made adjustments as needed   Consultations Obtained: na   Cardiac Monitoring: Continuous pulse oximetry interpreted by myself, 98% on ra.    Social Determinants of Health:  Diagnosis or treatment significantly limited by social determinants of health: former smoker   Reevaluation: After the interventions noted above, I reevaluated the patient and found that they have improved  Co morbidities that complicate the patient evaluation  Past Medical History:  Diagnosis Date   Acute pain of left knee 04/18/2014   Asthma    CAD in native artery 12/05/2014   Contusion of right foot 08/20/2017   Coronary artery disease    Dyslipidemia 12/16/2016   H/O heart artery stent 09/17/2018   Hamstring strain, left, initial encounter 02/19/2018   Hyperlipidemia    Hypertension    Pain in right buttock 03/05/2017   Primary osteoarthritis of left knee 02/19/2018   Primary osteoarthritis of right knee 04/18/2014   Radiculopathy of lumbar region 04/03/2017   Right foot pain 08/20/2017   S/P TKR (total knee replacement) using cement, 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    Final Clinical Impression(s) / ED Diagnoses Final diagnoses:  Musculoskeletal pain  Right flank pain  Diverticulosis        Elnor Savant LABOR,  DO 08/22/23 1322    Elnor Savant A, DO 08/22/23 1323

## 2023-08-22 NOTE — Discharge Instructions (Addendum)
 Please follow-up with your Primary Care Physician within the next week. Please take your medications as instructed and discuss any changes to your medications with your primary care physician.    Please return to the Emergency Department if you have any leg numbness, leg weakness, difficulty walking, fevers, worsening of pain, lightheadedness, lose consciousness, severe abdominal pain, severe headache, difficulty urinating, or difficulty having a bowel movement.  Recommend that you try to take it easy over the next 5 days, no heavy lifting or heavy exercise.   Please return to the emergency department immediately for any new or concerning symptoms, or if you get worse.

## 2023-08-22 NOTE — ED Notes (Signed)
 Pt complaining of lower right sided back pain. St states he does not want anything for the pain he will just live with it

## 2023-08-30 ENCOUNTER — Ambulatory Visit
Admission: RE | Admit: 2023-08-30 | Discharge: 2023-08-30 | Disposition: A | Discharge status: Home / Self Care | Source: Ambulatory Visit | Attending: Physical Medicine and Rehabilitation | Admitting: Physical Medicine and Rehabilitation

## 2023-08-30 DIAGNOSIS — M545 Low back pain, unspecified: Secondary | ICD-10-CM

## 2023-08-30 DIAGNOSIS — M47817 Spondylosis without myelopathy or radiculopathy, lumbosacral region: Secondary | ICD-10-CM

## 2023-09-01 ENCOUNTER — Telehealth: Payer: Self-pay | Admitting: Radiology

## 2023-09-01 ENCOUNTER — Other Ambulatory Visit: Payer: Self-pay | Admitting: Physical Medicine and Rehabilitation

## 2023-09-01 NOTE — Telephone Encounter (Signed)
 Patient called states that he is in a lot of pain and that he would like to know what he can take for this. He states that he has been taking tylenol  for it and its not helping. Please call patient to advise. Call back number is [phone]. He uses [facility] in [place].

## 2023-09-02 ENCOUNTER — Other Ambulatory Visit

## 2023-09-09 ENCOUNTER — Encounter: Payer: Self-pay | Admitting: Physical Medicine and Rehabilitation

## 2023-09-09 ENCOUNTER — Ambulatory Visit: Admitting: Physical Medicine and Rehabilitation

## 2023-09-09 DIAGNOSIS — M47816 Spondylosis without myelopathy or radiculopathy, lumbar region: Secondary | ICD-10-CM | POA: Diagnosis not present

## 2023-09-09 DIAGNOSIS — G8929 Other chronic pain: Secondary | ICD-10-CM

## 2023-09-09 DIAGNOSIS — M47817 Spondylosis without myelopathy or radiculopathy, lumbosacral region: Secondary | ICD-10-CM

## 2023-09-09 DIAGNOSIS — M545 Low back pain, unspecified: Secondary | ICD-10-CM

## 2023-09-09 DIAGNOSIS — M961 Postlaminectomy syndrome, not elsewhere classified: Secondary | ICD-10-CM

## 2023-09-09 NOTE — Progress Notes (Signed)
 Sean Marks - 78 y.o. male MRN [id number]  Date of birth: [birth year]  Office Visit Note: Visit Date: 09/09/2023 PCP: Jama Chow, MD Referred by: Jama Chow, MD  Subjective: Chief Complaint  Patient presents with   Lower Back - Pain   HPI: Sean Marks is a 78 y.o. male who comes in today for evaluation of chronic, worsening and severe right sided lower back pain. Patient is poor historian due to Alzheimer's Disease. His wife is accompanying him during our visit today. He is previous patient of Dr. Lynwood Better. Pain ongoing for several years. He describes pain as sore and aching sensation, currently rates as 10 out of 10. 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 Reports severe pain when moving from sitting to standing position. Some relief of pain with home exercise regimen, rest and use of medications. He was recently evaluated in the emergency room on 08/22/2023 for continued lower back pain. He was discharged home and prescribed Flexeril  and Norco, he did not pick up prescriptions from pharmacy. Recent lumbar MRI imaging shows multi level spondylosis, multi level moderate to severe foraminal stenosis. There is advanced bilateral facet arthropathy at L4-L5 and L5-S1, bilateral lateral recess noted at this level. History or prior lumbar discectomy with Dr. Better in 2012. Patient denies focal weakness, numbness and tingling. No recent trauma or falls.      Review of Systems  Musculoskeletal:  Positive for back pain.  Neurological:  Negative for tingling, sensory change, focal weakness and weakness.  All other systems reviewed and are negative.  Otherwise per HPI.  Assessment & Plan: Visit Diagnoses:    ICD-10-CM   1. Chronic right-sided low back pain without sciatica  M54.50 Ambulatory referral to Physical Medicine Rehab   G89.29     2. Spondylosis without myelopathy or radiculopathy, lumbosacral region  M47.817 Ambulatory referral to Physical Medicine Rehab     3. Facet arthropathy, lumbar  M47.816 Ambulatory referral to Physical Medicine Rehab    4. Post laminectomy syndrome  M96.1 Ambulatory referral to Physical Medicine Rehab       Plan: Findings:  Chronic, worsening and severe right sided lower back pain. No radicular symptoms down the legs. Patient continues to have severe pain despite good conservative therapies such as home exercise regimen, rest and use of medications. I discussed recent lumbar MRI with patient and wife today using imaging and spine model. Patients clinical presentation and exam are consistent with facet mediated pain. He does have pain with lumbar extension today. We discussed treatment plan in detail today. Next step is to perform diagnostic and hopefully therapeutic right L4-L5 and L5-S1 facet joint injection under fluoroscopic guidance. Patient was under the impression he would be able to undergo surgery as he did in the past with Dr. Better. I explained to him that his prior surgery was more for disc herniation and his pain today is more arthritic in nature. Dr. Eldonna at bedside as well to discuss plan. We will see patient back for injection pending insurance approval. I encouraged wife to call pharmacy to see about picking up prescriptions that were provided from ED. I am happy to refill as needed. No red flag symptoms noted upon exam today.     Meds & Orders: No orders of the defined types were placed in this encounter.   Orders Placed This Encounter  Procedures   Ambulatory referral to Physical Medicine Rehab    Follow-up: Return for Right L4-L5 and  L5-S1 facet joint injection.   Procedures: No procedures performed      Clinical History: Narrative & Impression CLINICAL DATA:  Low back pain   EXAM: MRI LUMBAR SPINE WITHOUT CONTRAST   TECHNIQUE: Multiplanar, multisequence MR imaging of the lumbar spine was performed. No intravenous contrast was administered.   COMPARISON:  05/12/2019    FINDINGS: Segmentation:  Standard.   Alignment:  No significant listhesis.   Vertebrae:  No fracture, evidence of discitis, or bone lesion.   Conus medullaris and cauda equina: Conus extends to the L1-2 level. Conus and cauda equina appear normal.   Paraspinal and other soft tissues: Negative.   Disc levels:   T12-L1: Unremarkable.   L1-L2: Annular disc bulge with shallow central disc protrusion. No significant foraminal or canal stenosis. No interval progression.   L2-L3: Previously seen cranially extending left paracentral disc extrusion has resolved. There is a annular disc bulge with mild facet hypertrophy. Mild canal stenosis with left-sided subarticular recess stenosis. Moderate-severe left and mild-moderate right foraminal stenosis, which has progressed.   L3-L4: Annular disc bulge with moderate bilateral facet arthropathy. Mild canal stenosis with left-sided subarticular recess stenosis. Moderate-severe left and moderate right foraminal stenosis. Minimal interval progression.   L4-L5: Disc bulge and endplate spurring with bilateral facet arthropathy. No canal stenosis. Moderate bilateral foraminal stenosis. No significant progression.   L5-S1: Disc bulge and endplate spurring with advanced bilateral facet arthropathy. No canal stenosis. Severe bilateral foraminal stenosis. No significant progression.   IMPRESSION: 1. Multilevel lumbar spondylosis, minimally progressed since 2021. 2. Mild canal stenosis at L2-L3 and L3-L4. 3. Moderate-severe left and mild-moderate right foraminal stenosis at L2-L3. 4. Moderate-severe left and moderate right foraminal stenosis at L3-L4. 5. Severe bilateral foraminal stenosis at L5-S1.     Electronically Signed   By: Mabel Converse D.O.   On: 08/31/2023 11:31   He reports that he has quit smoking. He has never been exposed to tobacco smoke. He has never used smokeless tobacco. No results for input(s): HGBA1C, LABURIC  in the last 8760 hours.  Objective:  VS:  HT:    WT:   BMI:     BP:   HR: bpm  TEMP: ( )  RESP:  Physical Exam Vitals and nursing note reviewed.  HENT:     Head: Normocephalic and atraumatic.     Right Ear: External ear normal.     Left Ear: External ear normal.     Nose: Nose normal.     Mouth/Throat:     Mouth: Mucous membranes are moist.  Eyes:     Extraocular Movements: Extraocular movements intact.  Cardiovascular:     Rate and Rhythm: Normal rate.     Pulses: Normal pulses.  Pulmonary:     Effort: Pulmonary effort is normal.  Abdominal:     General: Abdomen is flat.  Musculoskeletal:        General: Tenderness present.     Cervical back: Normal range of motion.     Comments: Patient is slow to rise from seated position to standing. Pain noted with facet loading and lumbar extension. 5/5 strength noted with bilateral hip flexion, knee flexion/extension, ankle dorsiflexion/plantarflexion and EHL. No clonus noted bilaterally. No pain upon palpation of greater trochanters. No pain with internal/external rotation of bilateral hips. Sensation intact bilaterally. Negative slump test bilaterally. Ambulates without aid, gait steady.     Skin:    General: Skin is warm and dry.     Capillary Refill: Capillary refill takes less  than 2 seconds.  Neurological:     General: No focal deficit present.     Mental Status: He is alert and oriented to person, place, and time.  Psychiatric:        Mood and Affect: Mood normal.        Behavior: Behavior normal.     Ortho Exam  Imaging: No results found.  Past Medical/Family/Surgical/Social History: Medications & Allergies reviewed per EMR, new medications updated. 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   Hypertension    Coronary artery disease    Asthma    H/O heart artery stent 09/17/2018    Hamstring strain, left, initial encounter 02/19/2018   Primary osteoarthritis of left knee 02/19/2018   Contusion of right foot 08/20/2017   Right foot pain 08/20/2017   Radiculopathy of lumbar region 04/03/2017   Pain in right buttock 03/05/2017   Dyslipidemia 12/16/2016   CAD in native artery 12/05/2014   Hyperlipidemia 12/05/2014   Tear of medial meniscus of left knee, subsequent encounter 05/18/2014   Acute pain of left knee 04/18/2014   Primary osteoarthritis of right knee 04/18/2014   Past Medical History:  Diagnosis Date   Acute pain of left knee 04/18/2014   Asthma    CAD in native artery 12/05/2014   Contusion of right foot 08/20/2017   Coronary artery disease    Dyslipidemia 12/16/2016   H/O heart artery stent 09/17/2018   Hamstring strain, left, initial encounter 02/19/2018   Hyperlipidemia    Hypertension    Pain in right buttock 03/05/2017   Primary osteoarthritis of left knee 02/19/2018   Primary osteoarthritis of right knee 04/18/2014   Radiculopathy of lumbar region 04/03/2017   Right foot pain 08/20/2017   S/P TKR (total knee replacement) using cement, 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

## 2023-09-09 NOTE — Progress Notes (Signed)
 Pain Scale   Average Pain 10 Patient advised his lower back pain is constant and he never gets any relief. Patient is here for an MRI review.        +Driver, -BT, -Dye Allergies.

## 2023-09-14 ENCOUNTER — Ambulatory Visit: Admitting: Physical Medicine and Rehabilitation

## 2023-09-22 ENCOUNTER — Ambulatory Visit: Admitting: Physical Medicine and Rehabilitation

## 2023-10-08 ENCOUNTER — Encounter: Admitting: Physical Medicine and Rehabilitation

## 2023-11-30 NOTE — Progress Notes (Unsigned)
 Cardiology Office Note:    Date:  12/01/2023   ID:  [name], DOB 07-31-1945, MRN [id number]  PCP:  Jama Chow, MD  Cardiologist:  Redell Leiter, MD    Referring MD: Jama Chow, MD    ASSESSMENT:    1. CAD in native artery   2. Essential hypertension   3. Hyperlipidemia LDL goal <70   4. Alzheimer's dementia, unspecified dementia severity, unspecified timing of dementia onset, unspecified whether behavioral, psychotic, or mood disturbance or anxiety (HCC)    PLAN:    In order of problems listed above:  Mr. Capaldi continues to do well with CAD following remote PCI and on current medical therapy [place]  Heart Association class I Continue therapy including aspirin  and his statin.  He has not needed to use his nitroglycerin  Continue his current statin recheck lipid profile today along with fish oil    Next appointment: 9 months   Medication Adjustments/Labs and Tests Ordered: Current medicines are reviewed at length with the patient today.  Concerns regarding medicines are outlined above.  Orders Placed This Encounter  Procedures   Comp Met (CMET)   Lipid Profile   No orders of the defined types were placed in this encounter.    History of Present Illness:    Sean Marks is a 78 y.o. male with a hx of CAD with previous remote PCI left circumflex coronary artery hypertension hyperlipidemia and recently dementia last seen 03/25/2023.  He had an EKG [place] [facility] June showing sinus rhythm APCs otherwise normal  Compliance with diet, lifestyle and medications: Yes  Always nice to see Mr. Geiler up no need for decades. 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 and he is having no angina edema shortness of breath palpitation or syncope Tolerates his statin without muscle pain or weakness Dementia stable not severe limiting Past Medical History:  Diagnosis Date   Acute pain of left knee 04/18/2014   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   Asthma    CAD in native artery  12/05/2014   Contusion of right foot 08/20/2017   Coronary artery disease    Dyslipidemia 12/16/2016   H/O heart artery stent 09/17/2018   Hamstring strain, left, initial encounter 02/19/2018   Hyperlipidemia    Hypertension    Memory loss 04/21/2023   Pain in right buttock 03/05/2017   Primary osteoarthritis of left knee 02/19/2018   Primary osteoarthritis of right knee 04/18/2014   Radiculopathy of lumbar region 04/03/2017   Right foot pain 08/20/2017   S/P TKR (total knee replacement) using cement, 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   galantamine  (RAZADYNE ) 8 MG tablet Take 1 tablet (8 mg total) by mouth 2 (two) times daily.   nitroGLYCERIN  (NITROSTAT ) 0.4 MG SL tablet Place 0.4 mg under the tongue every 5 (five) minutes as needed for chest pain.   Omega-3 Fatty Acids (FISH OIL ) 1000 MG CAPS Take 1 capsule (1,000 mg total) by mouth 2 (two) times daily.   omeprazole (PRILOSEC) 40 MG capsule Take 40 mg by mouth every other day.   ramipril  (ALTACE ) 2.5 MG capsule Take 2.5 mg by mouth 2 (two) times daily.   simvastatin  (ZOCOR ) 40 MG tablet TAKE 1 TABLET BY MOUTH EVERYDAY AT BEDTIME   Current Facility-Administered Medications for the 12/01/23 encounter (  Office Visit) with Monetta Redell PARAS, MD  Medication   methylPREDNISolone  acetate (DEPO-MEDROL ) injection 80 mg      EKGs/Labs/Other Studies Reviewed:    The following studies were reviewed today:  Cardiac Studies & Procedures   ______________________________________________________________________________________________   STRESS TESTS  MYOCARDIAL PERFUSION IMAGING 03/26/2023  Interpretation Summary   The study is normal. The study is low risk.   No ST deviation was noted.   Left ventricular function is normal. Nuclear stress EF: 66%. The  left ventricular ejection fraction is hyperdynamic (>65%). End diastolic cavity size is normal.            ______________________________________________________________________________________________          Recent Labs: 08/22/2023: ALT 26; BUN 17; Creatinine, Ser 1.15; Hemoglobin 14.5; Platelets 251; Potassium 4.1; Sodium 137  Recent Lipid Panel    Component Value Date/Time   CHOL 183 03/25/2023 0900   TRIG 199 (H) 03/25/2023 0900   HDL 49 03/25/2023 0900   CHOLHDL 3.7 03/25/2023 0900   LDLCALC 100 (H) 03/25/2023 0900    Physical Exam:    VS:  BP 110/60   Pulse 70   Ht 5' 11 (1.803 m)   Wt 210 lb 3.2 oz (95.3 kg)   SpO2 98%   BMI 29.32 kg/m     Wt Readings from Last 3 Encounters:  12/01/23 210 lb 3.2 oz (95.3 kg)  08/22/23 200 lb (90.7 kg)  05/07/23 213 lb (96.6 kg)     GEN:  Well nourished, well developed in no acute distress HEENT: Normal NECK: No JVD; No carotid bruits LYMPHATICS: No lymphadenopathy CARDIAC: RRR, no murmurs, rubs, gallops RESPIRATORY:  Clear to auscultation without rales, wheezing or rhonchi  ABDOMEN: Soft, non-tender, non-distended MUSCULOSKELETAL:  No edema; No deformity  SKIN: Warm and dry NEUROLOGIC:  Alert and oriented x 3 PSYCHIATRIC:  Normal affect    Signed, Redell Monetta, MD  12/01/2023 3:52 PM    [facility]

## 2023-12-01 ENCOUNTER — Ambulatory Visit: Attending: Cardiology | Admitting: Cardiology

## 2023-12-01 ENCOUNTER — Encounter: Payer: Self-pay | Admitting: Cardiology

## 2023-12-01 VITALS — BP 110/60 | HR 70 | Ht 71.0 in | Wt 210.2 lb

## 2023-12-01 DIAGNOSIS — G309 Alzheimer's disease, unspecified: Secondary | ICD-10-CM

## 2023-12-01 DIAGNOSIS — I10 Essential (primary) hypertension: Secondary | ICD-10-CM | POA: Diagnosis not present

## 2023-12-01 DIAGNOSIS — F0280 Dementia in other diseases classified elsewhere without behavioral disturbance: Secondary | ICD-10-CM

## 2023-12-01 DIAGNOSIS — E785 Hyperlipidemia, unspecified: Secondary | ICD-10-CM | POA: Diagnosis not present

## 2023-12-01 DIAGNOSIS — I2510 Atherosclerotic heart disease of native coronary artery without angina pectoris: Secondary | ICD-10-CM | POA: Diagnosis not present

## 2023-12-01 NOTE — Patient Instructions (Signed)
 Medication Instructions:  Your physician recommends that you continue on your current medications as directed. Please refer to the Current Medication list given to you today.  *If you need a refill on your cardiac medications before your next appointment, please call your pharmacy*  Lab Work: Your physician recommends that you return for lab work in:   Labs today: CMP/Lipids   If you have labs (blood work) drawn today and your tests are completely normal, you will receive your results only by: MyChart Message (if you have MyChart) OR A paper copy in the mail If you have any lab test that is abnormal or we need to change your treatment, we will call you to review the results.  Testing/Procedures: None  Follow-Up: At [facility], you and your health needs are our priority.  As part of our continuing mission to provide you with exceptional heart care, our providers are all part of one team.  This team includes your primary Cardiologist (physician) and Advanced Practice Providers or APPs (Physician Assistants and Nurse Practitioners) who all work together to provide you with the care you need, when you need it.  Your next appointment:   9 month(s)  Provider:   Redell Leiter, MD    We recommend signing up for the patient portal called MyChart.  Sign up information is provided on this After Visit Summary.  MyChart is used to connect with patients for Virtual Visits (Telemedicine).  Patients are able to view lab/test results, encounter notes, upcoming appointments, etc.  Non-urgent messages can be sent to your provider as well.   To learn more about what you can do with MyChart, go to [email or web address].   Other Instructions None

## 2023-12-02 ENCOUNTER — Ambulatory Visit: Payer: Self-pay | Admitting: Cardiology

## 2023-12-02 LAB — COMPREHENSIVE METABOLIC PANEL WITH GFR
ALT: 23 IU/L (ref 0–44)
AST: 22 IU/L (ref 0–40)
Albumin: 4.4 g/dL (ref 3.8–4.8)
Alkaline Phosphatase: 80 IU/L (ref 47–123)
BUN/Creatinine Ratio: 12 (ref 10–24)
BUN: 15 mg/dL (ref 8–27)
Bilirubin Total: 0.3 mg/dL (ref 0.0–1.2)
CO2: 23 mmol/L (ref 20–29)
Calcium: 9.6 mg/dL (ref 8.6–10.2)
Chloride: 101 mmol/L (ref 96–106)
Creatinine, Ser: 1.23 mg/dL (ref 0.76–1.27)
Globulin, Total: 2.8 g/dL (ref 1.5–4.5)
Glucose: 103 mg/dL — ABNORMAL HIGH (ref 70–99)
Potassium: 4.8 mmol/L (ref 3.5–5.2)
Sodium: 139 mmol/L (ref 134–144)
Total Protein: 7.2 g/dL (ref 6.0–8.5)
eGFR: 61 mL/min/1.73 (ref >59)

## 2023-12-02 LAB — LIPID PANEL
Chol/HDL Ratio: 4.1 ratio (ref 0.0–5.0)
Cholesterol, Total: 168 mg/dL (ref 100–199)
HDL: 41 mg/dL (ref >39)
LDL Chol Calc (NIH): 75 mg/dL (ref 0–99)
Triglycerides: 325 mg/dL — ABNORMAL HIGH (ref 0–149)
VLDL Cholesterol Cal: 52 mg/dL — ABNORMAL HIGH (ref 5–40)

## 2024-04-20 ENCOUNTER — Ambulatory Visit: Payer: Medicare HMO | Admitting: Neurology

## 2024-04-27 ENCOUNTER — Ambulatory Visit: Admitting: Physician Assistant
